# Patient Record
Sex: Female | Born: 1960 | Hispanic: No | Marital: Married | Smoking: Never smoker
Health system: Southern US, Community
[De-identification: ages and names within clinical notes are randomized; demographics above are authoritative.]

## PROBLEM LIST (undated history)

## (undated) DIAGNOSIS — F419 Anxiety disorder, unspecified: Secondary | ICD-10-CM

## (undated) DIAGNOSIS — Z973 Presence of spectacles and contact lenses: Secondary | ICD-10-CM

## (undated) DIAGNOSIS — C50919 Malignant neoplasm of unspecified site of unspecified female breast: Secondary | ICD-10-CM

## (undated) HISTORY — DX: Malignant neoplasm of unspecified site of unspecified female breast: C50.919

## (undated) HISTORY — DX: Anxiety disorder, unspecified: F41.9

---

## 2007-02-16 ENCOUNTER — Emergency Department (HOSPITAL_COMMUNITY): Admission: EM | Admit: 2007-02-16 | Discharge: 2007-02-17 | Payer: Self-pay | Admitting: Emergency Medicine

## 2012-12-17 ENCOUNTER — Emergency Department (HOSPITAL_COMMUNITY): Payer: Managed Care, Other (non HMO)

## 2012-12-17 ENCOUNTER — Encounter (HOSPITAL_COMMUNITY): Payer: Self-pay | Admitting: *Deleted

## 2012-12-17 ENCOUNTER — Emergency Department (HOSPITAL_COMMUNITY)
Admission: EM | Admit: 2012-12-17 | Discharge: 2012-12-17 | Disposition: A | Discharge status: Home / Self Care | Payer: Managed Care, Other (non HMO) | Attending: Emergency Medicine | Admitting: Emergency Medicine

## 2012-12-17 DIAGNOSIS — M545 Low back pain, unspecified: Secondary | ICD-10-CM | POA: Insufficient documentation

## 2012-12-17 DIAGNOSIS — Z3202 Encounter for pregnancy test, result negative: Secondary | ICD-10-CM | POA: Insufficient documentation

## 2012-12-17 DIAGNOSIS — I498 Other specified cardiac arrhythmias: Secondary | ICD-10-CM | POA: Insufficient documentation

## 2012-12-17 DIAGNOSIS — N201 Calculus of ureter: Secondary | ICD-10-CM

## 2012-12-17 DIAGNOSIS — N211 Calculus in urethra: Secondary | ICD-10-CM | POA: Insufficient documentation

## 2012-12-17 LAB — URINALYSIS, ROUTINE W REFLEX MICROSCOPIC
Glucose, UA: NEGATIVE mg/dL
Leukocytes, UA: NEGATIVE
Specific Gravity, Urine: 1.018 (ref 1.005–1.030)
pH: 7 (ref 5.0–8.0)

## 2012-12-17 LAB — COMPREHENSIVE METABOLIC PANEL
AST: 19 U/L (ref 0–37)
Albumin: 4.2 g/dL (ref 3.5–5.2)
Calcium: 9 mg/dL (ref 8.4–10.5)
Chloride: 102 mEq/L (ref 96–112)
Creatinine, Ser: 0.55 mg/dL (ref 0.50–1.10)
Total Bilirubin: 0.6 mg/dL (ref 0.3–1.2)
Total Protein: 7 g/dL (ref 6.0–8.3)

## 2012-12-17 LAB — CBC
MCV: 89.8 fL (ref 78.0–100.0)
Platelets: 197 10*3/uL (ref 150–400)
RDW: 12.5 % (ref 11.5–15.5)
WBC: 12.9 10*3/uL — ABNORMAL HIGH (ref 4.0–10.5)

## 2012-12-17 LAB — URINE MICROSCOPIC-ADD ON

## 2012-12-17 LAB — PREGNANCY, URINE: Preg Test, Ur: NEGATIVE

## 2012-12-17 MED ORDER — ONDANSETRON HCL 4 MG/2ML IJ SOLN
4.0000 mg | Freq: Once | INTRAMUSCULAR | Status: AC
  Administered 2012-12-17: 4 mg via INTRAVENOUS
  Filled 2012-12-17: qty 2

## 2012-12-17 MED ORDER — ONDANSETRON HCL 4 MG PO TABS: 4.0000 mg | ORAL_TABLET | Freq: Four times a day (QID) | ORAL | Status: DC

## 2012-12-17 MED ORDER — SODIUM CHLORIDE 0.9 % IV SOLN
Freq: Once | INTRAVENOUS | Status: AC
  Administered 2012-12-17: 12:00:00 via INTRAVENOUS

## 2012-12-17 MED ORDER — MORPHINE SULFATE 4 MG/ML IJ SOLN
4.0000 mg | Freq: Once | INTRAMUSCULAR | Status: AC
  Administered 2012-12-17: 4 mg via INTRAVENOUS
  Filled 2012-12-17: qty 1

## 2012-12-17 MED ORDER — HYDROMORPHONE HCL PF 1 MG/ML IJ SOLN
1.0000 mg | Freq: Once | INTRAMUSCULAR | Status: AC
  Administered 2012-12-17: 1 mg via INTRAVENOUS
  Filled 2012-12-17: qty 1

## 2012-12-17 MED ORDER — OXYCODONE-ACETAMINOPHEN 5-325 MG PO TABS: 1.0000 | ORAL_TABLET | Freq: Four times a day (QID) | ORAL | Status: DC | PRN

## 2012-12-17 MED ORDER — IOHEXOL 300 MG/ML  SOLN
50.0000 mL | Freq: Once | INTRAMUSCULAR | Status: AC | PRN
  Administered 2012-12-17: 50 mL via ORAL

## 2012-12-17 NOTE — ED Notes (Signed)
Medicated for n/v

## 2012-12-17 NOTE — ED Notes (Addendum)
Pt reports generalized mid to lower back pain started prior to abd pain x 2 days. Pain worse with certain positions. N/v started today. Pt very restless in stretcher upon arrival. Denies diarrhea, urinary frequency, dysuria. Last BM today. Pt vomited 50ml white emesis upon arrival. Abd soft, non distended

## 2012-12-17 NOTE — ED Notes (Signed)
Patient transported to CT 

## 2012-12-17 NOTE — ED Notes (Signed)
Pt denies any heavy lifting or injury to lower back. Also reports today has felt lightheaded & faint

## 2012-12-17 NOTE — ED Notes (Signed)
Pt brought in by [facility]; pt undressed, in gown, on monitor, continuous pulse oximetry and blood pressure cuff

## 2012-12-17 NOTE — ED Notes (Signed)
Pt from an urgent care, called EMS because she couldn't wait to be seen. C/o generalized abd pain, n/v, low back pain x 2 days.Upon EMS arrival pt very pale, diaphoretic, responding only to painful stimuli & had a faint palpable pulse of 41. After receiving NS bolus HR increased to 50's.

## 2012-12-17 NOTE — ED Provider Notes (Signed)
History     CSN: [id number]  Arrival date & time 12/17/12  1135   First MD Initiated Contact with Patient 12/17/12 1136      Chief Complaint  Patient presents with  . Bradycardia  . Abdominal Pain  . Back Pain    (Consider location/radiation/quality/duration/timing/severity/associated sxs/prior treatment) HPI Pt presents with c/o low back pain and diffuse abdominal pain.  Pt states back pain started yesterday and then today the abdominal pain worsened.  She points to umbilical region as the central region of pain.  She called EMS from an urgent care lobby due to worsening pain.  Per EMS she was diaphoretic on their arrival.  She also has had mutliple episodes of emesis today- nonbloody nonbilious.  She tried advil at home today without much relief.  She denies having any hx of similar pain. There are no other associated systemic symptoms, there are no other alleviating or modifying factors.   History reviewed. No pertinent past medical history.  Past Surgical History  Procedure Date  . Cesarean section     No family history on file.  History  Substance Use Topics  . Smoking status: Never Smoker   . Smokeless tobacco: Not on file  . Alcohol Use: No    OB History    Grav Para Term Preterm Abortions TAB SAB Ect Mult Living                  Review of Systems ROS reviewed and all otherwise negative except for mentioned in HPI  Allergies  Review of patient's allergies indicates no known allergies.  Home Medications   Current Outpatient Rx  Name  Route  Sig  Dispense  Refill  . IBUPROFEN 200 MG PO TABS   Oral   Take 400 mg by mouth every 6 (six) hours as needed. For pain         . ONDANSETRON HCL 4 MG PO TABS   Oral   Take 1 tablet (4 mg total) by mouth every 6 (six) hours.   12 tablet   0   . OXYCODONE-ACETAMINOPHEN 5-325 MG PO TABS   Oral   Take 1-2 tablets by mouth every 6 (six) hours as needed for pain.   20 tablet   0     BP 109/37  Pulse 51   Temp 98.3 F (36.8 C) (Oral)  Resp 23  Ht 5\' 3"  (1.6 m)  Wt 157 lb (71.215 kg)  BMI 27.81 kg/m2  SpO2 99%  LMP 09/16/2012 Vitals reviewed Physical Exam Physical Examination: General appearance - alert, well appearing, and in no distress, uncomfortable appearing Mental status - alert, oriented to person, place, and time Eyes - no conjunctival injection, no scleral icterus Mouth - mucous membranes moist, pharynx normal without lesions Chest - clear to auscultation, no wheezes, rales or rhonchi, symmetric air entry Heart - normal rate, regular rhythm, normal S1, S2, no murmurs, rubs, clicks or gallops Abdomen - soft, diffusely tender to palpation, no gaurding and no rebound, nondistended, no masses or organomegaly Back- + right CVA tenderness, no midline c/t/l tenderness Extremities - peripheral pulses normal, no pedal edema, no clubbing or cyanosis Skin - normal coloration and turgor, no rashes  ED Course  Procedures (including critical care time)    Date: 12/17/2012  Rate: 69  Rhythm: normal sinus rhythm  QRS Axis: normal  Intervals: normal  ST/T Wave abnormalities: normal  Conduction Disutrbances: none  Narrative Interpretation: unremarkable      Labs Reviewed  CBC - Abnormal; Notable for the following:    WBC 12.9 (*)     All other components within normal limits  COMPREHENSIVE METABOLIC PANEL - Abnormal; Notable for the following:    Potassium 3.4 (*)     Glucose, Bld 146 (*)     All other components within normal limits  URINALYSIS, ROUTINE W REFLEX MICROSCOPIC - Abnormal; Notable for the following:    APPearance CLOUDY (*)     Hgb urine dipstick LARGE (*)     Ketones, ur 40 (*)     All other components within normal limits  URINE MICROSCOPIC-ADD ON - Abnormal; Notable for the following:    Bacteria, UA FEW (*)     Casts WAXY CAST (*)     All other components within normal limits  LIPASE, BLOOD  PREGNANCY, URINE  URINE CULTURE   Ct Abdomen Pelvis Wo  Contrast  12/17/2012  *RADIOLOGY REPORT*  Clinical Data: Right flank and right pelvic pain, nausea, vomiting, back pain, abdominal pain, bradycardia, lightheadedness  CT ABDOMEN AND PELVIS WITHOUT CONTRAST  Technique:  Multidetector CT imaging of the abdomen and pelvis was performed following the standard protocol without intravenous contrast. Sagittal and coronal MPR images reconstructed from axial data set.  Comparison: None  Findings: Minimal dependent atelectasis at bilateral lung bases. Tiny nonobstructing renal calculus mid inferior pole right kidney image 39. Left hydronephrosis and hydroureter terminating at a 3 mm left UVJ calculus. Within limits of a nonenhanced exam no additional abnormalities of the liver, spleen, pancreas, kidneys or adrenal glands identified.  Normal appearance of the uterus, adnexae, appendix, and right ureter. Bladder otherwise unremarkable. Stomach and bowel loops grossly normal for technique. No mass, adenopathy, free fluid or free air. Persistent left IVC, normal variant. No acute osseous findings.  IMPRESSION: Left hydronephrosis and hydroureter secondary to a 3 mm left UVJ calculus. Tiny nonobstructing right renal calculus.   Original Report Authenticated By: Ulyses Southward, M.D.      1. Ureteral stone       MDM  Pt presenting with c/o low back pain and abdominal pain.  + rbcs in urine.  Mild elevation in WBC.  CT scan shows 3mm left UVJ stone and punctate right stone. Pt has received IV pain and nausea meds in the ED.  She feels improved.  D/w her and daughter the results at bedside.  Pt given information for urology followup.  D/c with rx for pain and nausea meds.  Discharged with strict return precautions.  Pt agreeable with plan.        Ethelda Chick, MD 12/17/12 [phone]

## 2012-12-18 LAB — URINE CULTURE: Colony Count: 45000

## 2013-08-19 ENCOUNTER — Other Ambulatory Visit: Payer: Self-pay | Admitting: Nurse Practitioner

## 2013-08-19 DIAGNOSIS — Z1231 Encounter for screening mammogram for malignant neoplasm of breast: Secondary | ICD-10-CM

## 2013-09-10 ENCOUNTER — Ambulatory Visit
Admission: RE | Admit: 2013-09-10 | Discharge: 2013-09-10 | Disposition: A | Discharge status: Home / Self Care | Payer: No Typology Code available for payment source | Source: Ambulatory Visit | Attending: Nurse Practitioner | Admitting: Nurse Practitioner

## 2013-09-10 DIAGNOSIS — Z1231 Encounter for screening mammogram for malignant neoplasm of breast: Secondary | ICD-10-CM

## 2013-09-17 ENCOUNTER — Other Ambulatory Visit: Payer: Self-pay | Admitting: Nurse Practitioner

## 2013-09-17 DIAGNOSIS — R928 Other abnormal and inconclusive findings on diagnostic imaging of breast: Secondary | ICD-10-CM

## 2013-10-05 ENCOUNTER — Other Ambulatory Visit: Payer: No Typology Code available for payment source

## 2013-11-25 DIAGNOSIS — C50919 Malignant neoplasm of unspecified site of unspecified female breast: Secondary | ICD-10-CM

## 2013-11-25 HISTORY — DX: Malignant neoplasm of unspecified site of unspecified female breast: C50.919

## 2013-11-29 ENCOUNTER — Other Ambulatory Visit: Payer: Self-pay | Admitting: Nurse Practitioner

## 2013-11-29 ENCOUNTER — Ambulatory Visit
Admission: RE | Admit: 2013-11-29 | Discharge: 2013-11-29 | Disposition: A | Discharge status: Home / Self Care | Payer: No Typology Code available for payment source | Source: Ambulatory Visit | Attending: Nurse Practitioner | Admitting: Nurse Practitioner

## 2013-11-29 DIAGNOSIS — R928 Other abnormal and inconclusive findings on diagnostic imaging of breast: Secondary | ICD-10-CM

## 2013-12-09 ENCOUNTER — Other Ambulatory Visit: Payer: No Typology Code available for payment source

## 2013-12-10 ENCOUNTER — Other Ambulatory Visit: Payer: Self-pay | Admitting: Nurse Practitioner

## 2013-12-10 ENCOUNTER — Ambulatory Visit
Admission: RE | Admit: 2013-12-10 | Discharge: 2013-12-10 | Disposition: A | Discharge status: Home / Self Care | Payer: No Typology Code available for payment source | Source: Ambulatory Visit | Attending: Nurse Practitioner | Admitting: Nurse Practitioner

## 2013-12-10 DIAGNOSIS — R928 Other abnormal and inconclusive findings on diagnostic imaging of breast: Secondary | ICD-10-CM

## 2013-12-14 ENCOUNTER — Other Ambulatory Visit: Payer: Self-pay | Admitting: Nurse Practitioner

## 2013-12-14 ENCOUNTER — Ambulatory Visit
Admission: RE | Admit: 2013-12-14 | Discharge: 2013-12-14 | Disposition: A | Discharge status: Home / Self Care | Payer: No Typology Code available for payment source | Source: Ambulatory Visit | Attending: Nurse Practitioner | Admitting: Nurse Practitioner

## 2013-12-14 DIAGNOSIS — C50919 Malignant neoplasm of unspecified site of unspecified female breast: Secondary | ICD-10-CM

## 2013-12-14 DIAGNOSIS — R928 Other abnormal and inconclusive findings on diagnostic imaging of breast: Secondary | ICD-10-CM

## 2013-12-15 ENCOUNTER — Telehealth: Payer: Self-pay | Admitting: *Deleted

## 2013-12-15 NOTE — Telephone Encounter (Signed)
Left vm for pt to return call to schedule [facility] Appt.

## 2013-12-16 ENCOUNTER — Telehealth: Payer: Self-pay | Admitting: *Deleted

## 2013-12-16 DIAGNOSIS — C50412 Malignant neoplasm of upper-outer quadrant of left female breast: Secondary | ICD-10-CM | POA: Insufficient documentation

## 2013-12-16 NOTE — Telephone Encounter (Signed)
Confirmed [facility] for 12/22/13 at 0830.  Instructions and contact information given.

## 2013-12-21 ENCOUNTER — Ambulatory Visit
Admission: RE | Admit: 2013-12-21 | Discharge: 2013-12-21 | Disposition: A | Discharge status: Home / Self Care | Payer: No Typology Code available for payment source | Source: Ambulatory Visit | Attending: Nurse Practitioner | Admitting: Nurse Practitioner

## 2013-12-21 DIAGNOSIS — C50919 Malignant neoplasm of unspecified site of unspecified female breast: Secondary | ICD-10-CM

## 2013-12-21 MED ORDER — GADOBENATE DIMEGLUMINE 529 MG/ML IV SOLN
14.0000 mL | Freq: Once | INTRAVENOUS | Status: AC | PRN
  Administered 2013-12-21: 14 mL via INTRAVENOUS

## 2013-12-22 ENCOUNTER — Ambulatory Visit: Payer: No Typology Code available for payment source | Attending: Surgery | Admitting: Physical Therapy

## 2013-12-22 ENCOUNTER — Ambulatory Visit: Payer: No Typology Code available for payment source

## 2013-12-22 ENCOUNTER — Ambulatory Visit (HOSPITAL_BASED_OUTPATIENT_CLINIC_OR_DEPARTMENT_OTHER): Payer: No Typology Code available for payment source | Admitting: Surgery

## 2013-12-22 ENCOUNTER — Ambulatory Visit (HOSPITAL_BASED_OUTPATIENT_CLINIC_OR_DEPARTMENT_OTHER): Payer: No Typology Code available for payment source | Admitting: Oncology

## 2013-12-22 ENCOUNTER — Encounter: Payer: Self-pay | Admitting: Oncology

## 2013-12-22 ENCOUNTER — Ambulatory Visit
Admission: RE | Admit: 2013-12-22 | Discharge: 2013-12-22 | Disposition: A | Discharge status: Home / Self Care | Payer: No Typology Code available for payment source | Source: Ambulatory Visit | Attending: Radiation Oncology | Admitting: Radiation Oncology

## 2013-12-22 ENCOUNTER — Encounter (INDEPENDENT_AMBULATORY_CARE_PROVIDER_SITE_OTHER): Payer: Self-pay | Admitting: Surgery

## 2013-12-22 ENCOUNTER — Other Ambulatory Visit: Payer: No Typology Code available for payment source

## 2013-12-22 ENCOUNTER — Other Ambulatory Visit (HOSPITAL_BASED_OUTPATIENT_CLINIC_OR_DEPARTMENT_OTHER): Payer: No Typology Code available for payment source

## 2013-12-22 VITALS — BP 113/73 | HR 56 | Temp 98.6°F | Resp 18 | Ht 63.0 in | Wt 150.5 lb

## 2013-12-22 DIAGNOSIS — C50412 Malignant neoplasm of upper-outer quadrant of left female breast: Secondary | ICD-10-CM

## 2013-12-22 DIAGNOSIS — R293 Abnormal posture: Secondary | ICD-10-CM | POA: Insufficient documentation

## 2013-12-22 DIAGNOSIS — C50419 Malignant neoplasm of upper-outer quadrant of unspecified female breast: Secondary | ICD-10-CM

## 2013-12-22 DIAGNOSIS — C50919 Malignant neoplasm of unspecified site of unspecified female breast: Secondary | ICD-10-CM

## 2013-12-22 DIAGNOSIS — IMO0001 Reserved for inherently not codable concepts without codable children: Secondary | ICD-10-CM | POA: Insufficient documentation

## 2013-12-22 DIAGNOSIS — Z170 Estrogen receptor positive status [ER+]: Secondary | ICD-10-CM

## 2013-12-22 LAB — CBC WITH DIFFERENTIAL/PLATELET
BASO%: 0.8 % (ref 0.0–2.0)
Basophils Absolute: 0 10*3/uL (ref 0.0–0.1)
EOS%: 4.2 % (ref 0.0–7.0)
Eosinophils Absolute: 0.2 10*3/uL (ref 0.0–0.5)
HCT: 42.4 % (ref 34.8–46.6)
HGB: 14.4 g/dL (ref 11.6–15.9)
LYMPH%: 38.3 % (ref 14.0–49.7)
MCH: 31.4 pg (ref 25.1–34.0)
MCHC: 33.9 g/dL (ref 31.5–36.0)
MCV: 92.6 fL (ref 79.5–101.0)
MONO#: 0.4 10*3/uL (ref 0.1–0.9)
MONO%: 7.8 % (ref 0.0–14.0)
NEUT#: 2.7 10*3/uL (ref 1.5–6.5)
NEUT%: 48.9 % (ref 38.4–76.8)
PLATELETS: 197 10*3/uL (ref 145–400)
RBC: 4.58 10*6/uL (ref 3.70–5.45)
RDW: 13.1 % (ref 11.2–14.5)
WBC: 5.5 10*3/uL (ref 3.9–10.3)
lymph#: 2.1 10*3/uL (ref 0.9–3.3)

## 2013-12-22 LAB — COMPREHENSIVE METABOLIC PANEL (CC13)
ALBUMIN: 4.2 g/dL (ref 3.5–5.0)
ALK PHOS: 48 U/L (ref 40–150)
ALT: 15 U/L (ref 0–55)
AST: 17 U/L (ref 5–34)
Anion Gap: 10 mEq/L (ref 3–11)
BILIRUBIN TOTAL: 0.89 mg/dL (ref 0.20–1.20)
BUN: 10.2 mg/dL (ref 7.0–26.0)
CO2: 29 mEq/L (ref 22–29)
Calcium: 9.5 mg/dL (ref 8.4–10.4)
Chloride: 103 mEq/L (ref 98–109)
Creatinine: 0.7 mg/dL (ref 0.6–1.1)
Glucose: 97 mg/dl (ref 70–140)
POTASSIUM: 4 meq/L (ref 3.5–5.1)
Sodium: 142 mEq/L (ref 136–145)
TOTAL PROTEIN: 7.2 g/dL (ref 6.4–8.3)

## 2013-12-22 NOTE — Progress Notes (Signed)
Regina Martinez [id number] 27-Feb-1961 53 y.o. 12/22/2013 12:34 PM  CC Dr. Thea Silversmith Dr. Tandy Gaw, MD [place] [id number]  REASON FOR CONSULTATION:  53 year old female with new diagnosis of left breast cancer. Patient is seen in the multidisciplinary breast clinic for discussion of treatment options.  STAGE:   Breast cancer of upper-outer quadrant of left female breast   Primary site: Breast (Left)   Staging method: AJCC 7th Edition   Clinical: Stage IA (T1c, N0, cM0)   Summary: Stage IA (T1c, N0, cM0)  REFERRING PHYSICIAN: Dr. Marcello Moores Cornett  HISTORY OF PRESENT ILLNESS:  Regina Martinez is a 52 y.o. female.  Underwent a screening mammogram that revealed a left breast mass with calcifications in October 2014. This mass was in the posterior aspect of the upper outer quadrant at the 3:00 position. Patient went on to have an ultrasound of the left breast that did reveal 2 distinct masses one measuring 8 mm and the second measuring 4 mm. There was also noted to be a lymph node which was biopsied and it was negative for metastatic cancer. 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 She had MRI of the breasts performed that showed 2 areas with a full measurement at 1.4 cm. She was also noted to have 2 cm area of clumped nodular enhancement. Corresponding to the areas of biopsy-proven DCIS/invasive ductal carcinoma. There was no lymphadenopathy identified. Patient's case was discussed at the multidisciplinary breast conference. She is now seen in the multidisciplinary breast clinic for discussion of treatment options. She is accompanied by a friend. Her husband currently is in [place]. She is without any complaints related to her breast cancer.    Past Medical History: Past Medical History  Diagnosis Date  .  Anxiety     Past Surgical History: Past Surgical History  Procedure Laterality Date  . Cesarean section      Family History: Family History  Problem Relation Age of Onset  . Breast cancer Mother   . Breast cancer Sister     Social History History  Substance Use Topics  . Smoking status: Never Smoker   . Smokeless tobacco: Not on file  . Alcohol Use: No    Allergies: No Known Allergies  Current Medications: Current Outpatient Prescriptions  Medication Sig Dispense Refill  . Calcium Carb-Cholecalciferol (CALCIUM 600 + D PO) Take 1 tablet by mouth daily.      . Cholecalciferol (VITAMIN D-3 PO) Take 2,000 Int'l Units by mouth daily.      Marland Kitchen HYDROcodone-homatropine (HYCODAN) 5-1.5 MG/5ML syrup Take 5 mLs by mouth every 6 (six) hours as needed for cough.      Marland Kitchen ibuprofen (ADVIL,MOTRIN) 200 MG tablet Take 400 mg by mouth every 6 (six) hours as needed. For pain      . ondansetron (ZOFRAN) 4 MG tablet Take 1 tablet (4 mg total) by mouth every 6 (six) hours.  12 tablet  0  . oxyCODONE-acetaminophen (PERCOCET/ROXICET) 5-325 MG per tablet Take 1-2 tablets by mouth every 6 (six) hours as needed for pain.  20 tablet  0   No current facility-administered medications for this visit.    OB/GYN History:patient Endo Ronalee Belts menarche at age 66 menopause in 2011 she has not been on hormone replacement therapy. She's had 3 term births first live birth at 61.  Fertility Discussion: n/a  Prior History of Cancer: no  Health Maintenance:  Colonoscopy no Bone Density no Last PAP smear up to date  ECOG PERFORMANCE STATUS: 0 - Asymptomatic  Genetic Counseling/testing: yes, patient had a sister with breast cancer and mom with counts  REVIEW OF SYSTEMS:  A comprehensive review of systems was negative.  PHYSICAL EXAMINATION: Blood pressure 113/73, pulse 56, temperature 98.6 F (37 C), temperature source Oral, resp. rate 18, height 5' 3"  (1.6 m), weight 150 lb 8 oz (68.266 kg).  General:   well-nourished in no acute distress.  Eyes:  no scleral icterus.  ENT:  There were no oropharyngeal lesions.  Neck was without thyromegaly.  Lymphatics:  Negative cervical, supraclavicular or axillary adenopathy.  Respiratory: lungs were clear bilaterally without wheezing or crackles.  Cardiovascular:  Regular rate and rhythm, S1/S2, without murmur, rub or gallop.  There was no pedal edema.  GI:  abdomen was soft, flat, nontender, nondistended, without organomegaly.  Muscoloskeletal:  no spinal tenderness of palpation of vertebral spine.  Skin exam was without echymosis, petichae.  Neuro exam was nonfocal.  Patient was able to get on and off exam table without assistance.  Gait was normal.  Patient was alerted and oriented.  Attention was good.   Language was appropriate.  Mood was normal without depression.  Speech was not pressured.  Thought content was not tangential.   Breasts: right breast normal without mass, skin or nipple changes or axillary nodes, left breast appears normal there is an area of ecchymosis from the recent biopsy with some thickening at that site. Otherwise no nipple discharge no other skin changes no dominant masses..   STUDIES/RESULTS: Mr Breast Bilateral W Wo Contrast  12/21/2013   CLINICAL DATA:  Recently biopsy proven left breast 3 o'clock location invasive ductal carcinoma and DCIS with a positive satellite nodule 1 cm away from the dominant mass. Benign left axillary lymph node biopsy.  EXAM: BILATERAL BREAST MRI WITH AND WITHOUT CONTRAST  LABS:  None  TECHNIQUE: Multiplanar, multisequence MR images of both breasts were obtained prior to and following the intravenous administration of 68m of MultiHance.  THREE-DIMENSIONAL MR IMAGE RENDERING ON INDEPENDENT WORKSTATION:  Three-dimensional MR images were rendered by post-processing of the original MR data on an independent workstation. The three-dimensional MR images were interpreted, and findings are reported in the following  complete MRI report for this study. Three dimensional images were evaluated at the independent DynaCad workstation  COMPARISON:  Previous exams, CT abdomen pelvis 12/17/2012  FINDINGS: Breast composition: b.  Scattered fibroglandular tissue.  Background parenchymal enhancement: Minimal  Right breast: No mass or abnormal enhancement.  Left breast: In the left breast far posterior 3 o'clock location, there is a 2 cm area of clumped nodular enhancement with minimal plateau type enhancement kinetics, and associated clip artifact, corresponding to the areas of biopsy proven DCIS/invasive ductal carcinoma. No other dominant area of abnormal enhancement is seen elsewhere in either breast.  Lymph nodes: No lymphadenopathy is identified, with minimal cortical prominence of normal-sized left axillary lymph nodes reidentified. One of these with minimal eccentric cortical thickening likely corresponds to a recently biopsied benign left axillary lymph node.  Ancillary findings: At incomplete imaging of the upper abdomen, there is a 5 mm nonenhancing T2 hyperintense mass in the lateral segment left hepatic lobe most likely a cyst. This is not visible on the prior dissimilar exam, possibly due to its small size.  IMPRESSION: 2 cm area of minimal clumped nodular enhancement with plateau type enhancement kinetics  at the site of biopsy proven invasive ductal carcinoma/DCIS. Allowing for the subtlety of this finding, there is no MRI evidence for multifocal/ multicentric or contralateral malignancy.  RECOMMENDATION: Treatment plan  BI-RADS CATEGORY  6: Known biopsy-proven malignancy - appropriate action should be taken.   Electronically Signed   By: Conchita Paris M.D.   On: 12/21/2013 10:38   Mm Digital Diagnostic Unilat L  12/10/2013   CLINICAL DATA:  Status post ultrasound-guided core needle biopsy of two adjacent suspicious left breast masses  EXAM: DIAGNOSTIC LEFT MAMMOGRAM POST ULTRASOUND BIOPSY  COMPARISON:  Previous exams   FINDINGS: Mammographic images were obtained following ultrasound guided biopsy of two adjacent suspicious left breast masses. Note the initially biopsied smaller mass contains a ribbon shaped clip and the subsequently biopsied larger mass contains a heart shaped clip.  IMPRESSION: Appropriate position of biopsy marking clips within the left breast status post ultrasound-guided core needle biopsy.  Final Assessment: Post Procedure Mammograms for Marker Placement   Electronically Signed   By: Lovey Newcomer M.D.   On: 12/10/2013 17:10   Mm Digital Diagnostic Unilat L  11/29/2013   CLINICAL DATA:  Patient was recalled from screening for left breast mass.  EXAM: DIGITAL DIAGNOSTIC  LEFT MAMMOGRAM  ULTRASOUND LEFT BREAST  COMPARISON:  None  ACR Breast Density Category c: The breasts are heterogeneously dense, which may obscure small masses.  FINDINGS: Within the left breast 3 o'clock position posterior depth there is a 1.2 cm irregular mass which persists on additional spot-compression magnification views. No definite calcifications are demonstrated within this mass.  On physical exam I palpate no discrete mass within the lateral aspect of the left breast.  Ultrasound is performed, showing a 0.8 x 0.6 x 0.8 cm irregular hypoechoic mass within the left breast 3 o'clock position 15 cm from the nipple. Additionally there is an enlarged lymph node within the left axilla.  IMPRESSION: Suspicious left breast mass with enlarged left axillary lymph node.  RECOMMENDATION: Ultrasound-guided core needle biopsy suspicious left breast mass and enlarged left axillary lymph node  Scheduled for December 09, 2013 at 8 a.m.  I have discussed the findings and recommendations with the patient. Results were also provided in writing at the conclusion of the visit. If applicable, a reminder letter will be sent to the patient regarding the next appointment.  BI-RADS CATEGORY  5: Highly suggestive of malignancy - appropriate action should be  taken.   Electronically Signed   By: Lovey Newcomer M.D.   On: 11/29/2013 09:38   Mm Radiologist Eval And Mgmt  12/14/2013   EXAM: ESTABLISHED PATIENT OFFICE VISIT - LEVEL II ([id number])  EXAM: At physical exam, there is mild ecchymosis at the left axillary and left breast 3 o'clock location biopsy sites. The incisions are otherwise clean and dry.  HISTORY OF PRESENT ILLNESS: Biopsy was recommended for an irregular left breast mass 3 o'clock location with an adjacent satellite nodule and abnormal left axillary lymph node.  CHIEF COMPLAINT: The patient underwent ultrasound-guided core biopsy of 2 locations in the left breast 3 o'clock location and left axilla 2015 and presents today for discussion of her biopsy results.  PATHOLOGY: Pathology demonstrates invasive ductal carcinoma and DCIS at both locations in the left breast 3 o'clock location but a benign left axillary lymph node.  ASSESSMENT AND PLAN: ASSESSMENT AND PLAN Treatment plan is recommended and breast MRI has been scheduled 12/21/2013 at 8:45 a.m. [facility] has been scheduled  12/22/2013. The patient may have a schedule conflict with her scheduled MRI 12/21/2013 but she is encouraged to re-schedule her MRI appointment if at all possible prior to her clinic appointment. All questions were answered. I also discussed these findings and recommendations with Regina Martinez, the daughter of the patient, by telephone at the patient's request.   Electronically Signed   By: Conchita Paris M.D.   On: 12/14/2013 15:10   US Breast Complete Uni Left Inc Axilla  11/29/2013   CLINICAL DATA:  Patient was recalled from screening for left breast mass.  EXAM: DIGITAL DIAGNOSTIC  LEFT MAMMOGRAM  ULTRASOUND LEFT BREAST  COMPARISON:  None  ACR Breast Density Category c: The breasts are heterogeneously dense, which may obscure small masses.  FINDINGS: Within the left breast 3 o'clock position posterior depth there is a 1.2 cm irregular  mass which persists on additional spot-compression magnification views. No definite calcifications are demonstrated within this mass.  On physical exam I palpate no discrete mass within the lateral aspect of the left breast.  Ultrasound is performed, showing a 0.8 x 0.6 x 0.8 cm irregular hypoechoic mass within the left breast 3 o'clock position 15 cm from the nipple. Additionally there is an enlarged lymph node within the left axilla.  IMPRESSION: Suspicious left breast mass with enlarged left axillary lymph node.  RECOMMENDATION: Ultrasound-guided core needle biopsy suspicious left breast mass and enlarged left axillary lymph node  Scheduled for December 09, 2013 at 8 a.m.  I have discussed the findings and recommendations with the patient. Results were also provided in writing at the conclusion of the visit. If applicable, a reminder letter will be sent to the patient regarding the next appointment.  BI-RADS CATEGORY  5: Highly suggestive of malignancy - appropriate action should be taken.   Electronically Signed   By: Lovey Newcomer M.D.   On: 11/29/2013 09:38   [place] Lt Breast Bx W Loc Dev 1st Lesion Img Bx Spec US Guide  12/10/2013   CLINICAL DATA:  Patient with suspicious left breast mass and left axillary lymphadenopathy.  EXAM: ULTRASOUND GUIDED LEFT BREAST CORE NEEDLE BIOPSY WITH VACUUM ASSIST  COMPARISON:  Previous exams.  PROCEDURE: I met with the patient and we discussed the procedure of ultrasound-guided biopsy, including benefits and alternatives. We discussed the high likelihood of a successful procedure. We discussed the risks of the procedure including infection, bleeding, tissue injury, clip migration, and inadequate sampling. Informed written consent was given. The usual time-out protocol was performed immediately prior to the procedure.  While performing the ultrasound to localize the left breast lesion it was noted that there is a separate 0.4 cm irregular hypoechoic lesion just adjacent to the  larger previously described mass within the left breast 3 o'clock position. These masses are approximately 1 cm apart. This mass will be biopsied as well as the larger previously described mass, both located within the 3 o'clock position left breast.  Lesion 1/smaller mass (ribbon clip):  Using sterile technique and 2% Lidocaine as local anesthetic, under direct ultrasound visualization, a 12 gauge vacuum-assisteddevice was used to perform biopsy of suspicious hypoechoic mass within the left breast using a lateral approach. At the conclusion of the procedure, a ribbon shaped tissue marker clip was deployed into the biopsy cavity. Follow-up 2-view mammogram was performed and dictated separately.  Lesion 2/larger mass (heart shaped):  Using sterile technique and 2% Lidocaine as local anesthetic, under direct ultrasound visualization, a 12 gauge vacuum-assisteddevice was used to perform biopsy of suspicious  hypoechoic mass within the left breast using a lateral approach. At the conclusion of the procedure, a heart shaped tissue marker clip was deployed into the biopsy cavity. Follow-up 2-view mammogram was performed and dictated separately.  Lesion 3 (left axillary lymph node):  Using sterile technique and 2% Lidocaine as local anesthetic, under direct ultrasound visualization, a 12 gauge vacuum-assisteddevice was used to perform biopsy of enlarged left axillary lymph node using a lateral approach. No clip was placed within the lymph node.  IMPRESSION: Ultrasound-guided biopsy of two suspicious left breast masses and enlarged left axillary lymph node. No apparent complications.  Note while localizing the initially described larger suspicious left breast mass a separate smaller adjacent suspicious mass was identified and both of these masses were biopsied separately. They are located approximately 1 cm apart.   Electronically Signed   By: Lovey Newcomer M.D.   On: 12/10/2013 17:09   [place] Lt Breast Bx W Loc Dev Ea Add  Lesion Img Bx Spec US Guide  12/10/2013   CLINICAL DATA:  Patient with suspicious left breast mass and left axillary lymphadenopathy.  EXAM: ULTRASOUND GUIDED LEFT BREAST CORE NEEDLE BIOPSY WITH VACUUM ASSIST  COMPARISON:  Previous exams.  PROCEDURE: I met with the patient and we discussed the procedure of ultrasound-guided biopsy, including benefits and alternatives. We discussed the high likelihood of a successful procedure. We discussed the risks of the procedure including infection, bleeding, tissue injury, clip migration, and inadequate sampling. Informed written consent was given. The usual time-out protocol was performed immediately prior to the procedure.  While performing the ultrasound to localize the left breast lesion it was noted that there is a separate 0.4 cm irregular hypoechoic lesion just adjacent to the larger previously described mass within the left breast 3 o'clock position. These masses are approximately 1 cm apart. This mass will be biopsied as well as the larger previously described mass, both located within the 3 o'clock position left breast.  Lesion 1/smaller mass (ribbon clip):  Using sterile technique and 2% Lidocaine as local anesthetic, under direct ultrasound visualization, a 12 gauge vacuum-assisteddevice was used to perform biopsy of suspicious hypoechoic mass within the left breast using a lateral approach. At the conclusion of the procedure, a ribbon shaped tissue marker clip was deployed into the biopsy cavity. Follow-up 2-view mammogram was performed and dictated separately.  Lesion 2/larger mass (heart shaped):  Using sterile technique and 2% Lidocaine as local anesthetic, under direct ultrasound visualization, a 12 gauge vacuum-assisteddevice was used to perform biopsy of suspicious hypoechoic mass within the left breast using a lateral approach. At the conclusion of the procedure, a heart shaped tissue marker clip was deployed into the biopsy cavity. Follow-up 2-view  mammogram was performed and dictated separately.  Lesion 3 (left axillary lymph node):  Using sterile technique and 2% Lidocaine as local anesthetic, under direct ultrasound visualization, a 12 gauge vacuum-assisteddevice was used to perform biopsy of enlarged left axillary lymph node using a lateral approach. No clip was placed within the lymph node.  IMPRESSION: Ultrasound-guided biopsy of two suspicious left breast masses and enlarged left axillary lymph node. No apparent complications.  Note while localizing the initially described larger suspicious left breast mass a separate smaller adjacent suspicious mass was identified and both of these masses were biopsied separately. They are located approximately 1 cm apart.   Electronically Signed   By: Lovey Newcomer M.D.   On: 12/10/2013 17:09   [place] Lt Breast Bx W Loc Dev Ea Add Lesion  Img Bx Spec US Guide  12/10/2013   CLINICAL DATA:  Patient with suspicious left breast mass and left axillary lymphadenopathy.  EXAM: ULTRASOUND GUIDED LEFT BREAST CORE NEEDLE BIOPSY WITH VACUUM ASSIST  COMPARISON:  Previous exams.  PROCEDURE: I met with the patient and we discussed the procedure of ultrasound-guided biopsy, including benefits and alternatives. We discussed the high likelihood of a successful procedure. We discussed the risks of the procedure including infection, bleeding, tissue injury, clip migration, and inadequate sampling. Informed written consent was given. The usual time-out protocol was performed immediately prior to the procedure.  While performing the ultrasound to localize the left breast lesion it was noted that there is a separate 0.4 cm irregular hypoechoic lesion just adjacent to the larger previously described mass within the left breast 3 o'clock position. These masses are approximately 1 cm apart. This mass will be biopsied as well as the larger previously described mass, both located within the 3 o'clock position left breast.  Lesion 1/smaller mass  (ribbon clip):  Using sterile technique and 2% Lidocaine as local anesthetic, under direct ultrasound visualization, a 12 gauge vacuum-assisteddevice was used to perform biopsy of suspicious hypoechoic mass within the left breast using a lateral approach. At the conclusion of the procedure, a ribbon shaped tissue marker clip was deployed into the biopsy cavity. Follow-up 2-view mammogram was performed and dictated separately.  Lesion 2/larger mass (heart shaped):  Using sterile technique and 2% Lidocaine as local anesthetic, under direct ultrasound visualization, a 12 gauge vacuum-assisteddevice was used to perform biopsy of suspicious hypoechoic mass within the left breast using a lateral approach. At the conclusion of the procedure, a heart shaped tissue marker clip was deployed into the biopsy cavity. Follow-up 2-view mammogram was performed and dictated separately.  Lesion 3 (left axillary lymph node):  Using sterile technique and 2% Lidocaine as local anesthetic, under direct ultrasound visualization, a 12 gauge vacuum-assisteddevice was used to perform biopsy of enlarged left axillary lymph node using a lateral approach. No clip was placed within the lymph node.  IMPRESSION: Ultrasound-guided biopsy of two suspicious left breast masses and enlarged left axillary lymph node. No apparent complications.  Note while localizing the initially described larger suspicious left breast mass a separate smaller adjacent suspicious mass was identified and both of these masses were biopsied separately. They are located approximately 1 cm apart.   Electronically Signed   By: Lovey Newcomer M.D.   On: 12/10/2013 17:09     LABS:    Chemistry      Component Value Date/Time   NA 142 12/22/2013 0824   NA 141 12/17/2012 1204   K 4.0 12/22/2013 0824   K 3.4* 12/17/2012 1204   CL 102 12/17/2012 1204   CO2 29 12/22/2013 0824   CO2 26 12/17/2012 1204   BUN 10.2 12/22/2013 0824   BUN 11 12/17/2012 1204   CREATININE 0.7 12/22/2013  0824   CREATININE 0.55 12/17/2012 1204      Component Value Date/Time   CALCIUM 9.5 12/22/2013 0824   CALCIUM 9.0 12/17/2012 1204   ALKPHOS 48 12/22/2013 0824   ALKPHOS 53 12/17/2012 1204   AST 17 12/22/2013 0824   AST 19 12/17/2012 1204   ALT 15 12/22/2013 0824   ALT 17 12/17/2012 1204   BILITOT 0.89 12/22/2013 0824   BILITOT 0.6 12/17/2012 1204      Lab Results  Component Value Date   WBC 5.5 12/22/2013   HGB 14.4 12/22/2013   HCT 42.4 12/22/2013  MCV 92.6 12/22/2013   PLT 197 12/22/2013    ASSESSMENT/PLAN  53 year old female with  #1 new diagnosis of 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 Found on a screening mammogram. Tumor is ER positive PR positive HER-2/neu positive. With proliferation marker Ki-67 in the intermediate range.  #2 We spent the better part of today's hour-long appointment discussing the biology of breast cancer in general, and the specifics of the patient's tumor in particular.Patient and I went over her pathology in detail today. We discussed the significance of the HER-2/neu receptor as well as the estrogen and progesterone receptors. We discussed her treatment options. Patient is a candidate for chemotherapy along with anti-HER-2 therapy such as Herceptin to be given adjuvantly in her case.we discussed the rationale as well as risks and benefits of chemotherapy as well as anti-HER-2 therapy. We discussed the neoadjuvant antiestrogen approach to treatment with chemotherapy and anti-HER-2 therapy. We also discussed role of adjuvant antiestrogen therapy. We discussed treatment with chemotherapy consisting of Taxotere and carboplatinum to be given every 3 weeks for a total of 6 cycles. We discussed Herceptin given weekly with chemotherapy. Once patient completes chemotherapy she will receive Herceptin every 3 weeks to finish out 1 year of Herceptin.  #3 we discussed genetic counseling and testing due to patient's family history of breast cancer  in her mother as well as her sister. We discussed the rationale for testing as well as implications of testing to her as well as her offspring spent other family members.  #4 we discussed the need for proceeding with a Port-A-Cath placement at the time of the lumpectomy and sentinel lymph node biopsy. We also discussed chemotherapy education class, echocardiogram to monitor patient's heart function throughout the HER-2 therapy. We also discussed role of cardiology in monitoring her heart function  #5 I will plan on seeing the patient back after she has had her definitive surgery.   Clinical Trial Eligibility: no Multidisciplinary conference discussion yes      Discussion: Patient is being treated per [facility] breast cancer care guidelines appropriate for stage.I   Thank you so much for allowing me to participate in the care of Regina Martinez. I will continue to follow up the patient with you and assist in her care.  All questions were answered. The patient knows to call the clinic with any problems, questions or concerns. We can certainly see the patient much sooner if necessary.  I spent 55 minutes counseling the patient face to face. The total time spent in the appointment was 60 minutes.  Marcy Panning, MD Medical/Oncology [facility] [phone] (beeper) [phone] (Office)  12/22/2013, 12:34 PM

## 2013-12-22 NOTE — Patient Instructions (Signed)
[facility]  neulasta  Echocardiogram  Cardiology referral

## 2013-12-22 NOTE — Patient Instructions (Signed)
Sentinel Lymph Node Biopsy Sentinel lymph node biopsy is a procedure in which a single lymph node is identified, removed, and examined for cancer. Lymph nodes are collections of tissue that help filter infections, cancer cells, and other waste substances from the bloodstream. Certain types of cancer can spread to nearby lymph nodes. The cancer spreads to one lymph node first, and then to others. The first lymph node that your cancer could spread to is called the sentinel lymph node. Examining the sentinel lymph node for cancer can help your caregiver plan future treatment for you. LET YOUR CAREGIVER KNOW ABOUT:  Allergies to food or medicine. Medicines taken, including vitamins, herbs, eyedrops, over-the-counter medicines, and creams. Use of steroids (by mouth or creams). Previous problems with numbing medicines. History of bleeding problems or blood clots. Previous surgery. Other health problems, including diabetes and kidney problems. Possibility of pregnancy, if this applies. RISKS AND COMPLICATIONS  Infection. Bleeding. Allergic reaction to the dye used for the procedure. Blue staining of the skin where the dye is injected. Damaged lymph vessels, causing a buildup of fluid (lymphedema). Pain or bruising at the biopsy site. BEFORE THE PROCEDURE  Stop smoking at least 2 weeks before the procedure. Not smoking will improve your health after the procedure and decrease the chance of getting a wound infection. You may have blood tests to make sure your blood clots normally. Ask your caregiver about changing or stopping your regular medicines. Do not eat or drink anything for 8 hours before the procedure. PROCEDURE  You will be given medicine that makes you sleep (general anesthetic). A blue, radioactive dye will be injected near the tumor. The dye will then spread into the sentinel lymph node. A scanner will identify the sentinel lymph node. A small cut (incision) will be made, and the  sentinel lymph node will be removed. The sentinel lymph node will be examined in a lab. Sometimes, a sentinel lymph node biopsy is performed during another surgery, such as a mastectomy or lumpectomy for breast cancer.  AFTER THE PROCEDURE  You will go to a recovery room. You will be monitored for several hours. If complications do not occur, you will be allowed to go home a few hours after the procedure. Your urine may be blue for the next 24 hours. This is normal. It is caused by the dye used during the procedure. Your skin where the dye was injected may be blue for up to 8 weeks. Document Released: 02/03/2012 Document Reviewed: 02/03/2012 [facility] Patient Information 2014 [place]. Lumpectomy A lumpectomy is a form of "breast conserving" or "breast preservation" surgery. It may also be referred to as a partial mastectomy. During a lumpectomy, the portion of the breast that contains the cancerous tumor or breast mass (the lump) is removed. Some normal tissue around the lump may also be removed to make sure all the tumor has been removed. This surgery should take 40 minutes or less. LET [facility] CARE PROVIDER KNOW ABOUT: Any allergies you have. All medicines you are taking, including vitamins, herbs, eye drops, creams, and over-the-counter medicines. Previous problems you or members of your family have had with the use of anesthetics. Any blood disorders you have. Previous surgeries you have had. Medical conditions you have. RISKS AND COMPLICATIONS Generally, this is a safe procedure. However, as with any procedure, complications can occur. Possible complications include: Bleeding. Infection. Pain. Temporary swelling. Change in the shape of the breast, particularly if a large portion is removed. BEFORE THE PROCEDURE Ask  your health care provider about changing or stopping your regular medicines. Do not eat or drink anything for 7 8 hours before the surgery or as directed by  your health care provider. Ask your health care provider if you can take a sip of water with any approved medicines. On the day of surgery, your healthcare provider will use a mammogram or ultrasound to locate and mark the tumor in your breast. These markings on your breast will show where the cut (incision) will be made. PROCEDURE  An IV tube will be put into one of your veins. You may be given medicine to help you relax before the surgery (sedative). You will be given one of the following: A medicine that numbs the area (local anesthesia). A medicine that makes you go to sleep (general anesthesia). Your health care provider will use a kind of electric scalpel that uses heat to minimize bleeding (electrocautery knife). A curved incision (like a smile or frown) that follows the natural curve of your breast is made, to allow for minimal scarring and better healing. The tumor will be removed with some of the surrounding tissue. This will be sent to the lab for analysis. Your health care provider may also remove your lymph nodes at this time if needed. Sometimes, but not always, a rubber tube called a drain will be surgically inserted into your breast area or armpit to collect excess fluid that may accumulate in the space where the tumor was. This drain is connected to a plastic bulb on the outside of your body. This drain creates suction to help remove the fluid. The incisions will be closed with stitches (sutures). A bandage may be placed over the incisions. AFTER THE PROCEDURE You will be taken to the recovery area. You will be given medicine for pain. A small rubber drain may be placed in the breast for 2 3 days to prevent a collection of blood (hematoma) from developing in the breast. You will be given instructions on caring for the drain before you go home. A pressure bandage (dressing) will be applied for 1 2 days to prevent bleeding. Ask your health care provider how to care for your bandage at  home. Document Released: 12/23/2006 Document Revised: 07/14/2013 Document Reviewed: 04/16/2013 [facility] Patient Information 2014 [place]. Implanted Port Insertion An implanted port is a central line that has a round shape and is placed under the skin. It is used as a long-term IV access for:   Medicines, such as chemotherapy.   Fluids.   Liquid nutrition, such as total parenteral nutrition (TPN).   Blood samples.  LET [facility] CARE PROVIDER KNOW ABOUT:  Allergies to food or medicine.   Medicines taken, including vitamins, herbs, eye drops, creams, and over-the-counter medicines.   Any allergies to heparin.  Use of steroids (by mouth or creams).   Previous problems with anesthetics or numbing medicines.   History of bleeding problems or blood clots.   Previous surgery.   Other health problems, including diabetes and kidney problems.   Possibility of pregnancy, if this applies. RISKS AND COMPLICATIONS  Damage to the blood vessel, bruising, or bleeding at the puncture site.   Infection.  Blood clot in the vessel that the port is in.  Breakdown of the skin over your port.  Very rarely a person may develop a condition called a pneumothorax, a collection of air in the chest that may cause one of the lungs to collapse. The placement of these catheters with the  appropriate imaging guidance significantly decreases the risk of a pneumothorax.  BEFORE THE PROCEDURE   Your health care provider may want you to have blood tests. These tests can help tell how well your kidneys and liver are working. They can also show how well your blood clots.   If you take blood thinners (anticoagulant medicines), ask your health care provider when you should stop taking them.   Make arrangements for someone to drive you home. This is necessary if you have been sedated for your procedure.  PROCEDURE  Port insertion usually takes about 30 45 minutes.   An IV needle  will be inserted in your arm. Medicine for pain and medicine to help relax you (sedative) will flow directly into your body through this needle.   You will lie on an exam table, and you will be connected to monitors to keep track of your heart rate, blood pressure, and breathing throughout the procedure.  An oxygen monitoring device may be attached to your finger. Oxygen will be given.   Everything is kept as germ free (sterile) as possible during the procedure. The skin near the point of the incision will be cleaned with antiseptic, and the area will be draped with sterile towels. The skin and deeper tissues over the port area will be made numb with a local anesthetic.  Two small cuts (incisions) will be made in the skin to insert the port. One will be made in the neck to obtain access to the vein where the catheter will lie.   Because the port reservoir is placed under the skin, a small skin incision is made in the upper chest, and a small pocket for the port is made under the skin. The catheter connected to the port tunnels to a large central vein in the chest. A small, raised area remains on your body at the site of the reservoir when the procedure is complete.  The port placement is done under imaging guidance to ensure the proper placement.  The reservoir has a silicone covering that can be punctured with a special needle.   The port is flushed with normal saline, and blood is drawn to make sure it is working properly.  There is nothing remaining outside the skin when the procedure is finished.   Incisions are held together by stitches, surgical glue, or a special tape.  AFTER THE PROCEDURE  You will stay in a recovery area until the anesthesia has worn off. Your blood pressure and pulse will be checked.  A final chest X-ray is taken to check the placement of the port and that there is no injury to your lung.  If there are no problems, you should be able to go home after the  procedure.  Document Released: 09/01/2013 Document Reviewed: 07/19/2013 [facility] Patient Information 2014 [place], [place].

## 2013-12-22 NOTE — Progress Notes (Signed)
Patient ID: Regina Martinez, female   DOB: 01/02/1961, 53 y.o.   MRN: [id number]  No chief complaint on file.   HPI Regina Martinez is a 53 y.o. female.   HPI pt sent at the request of Vicenta Aly, FNP for left breast cancer. Pt has had no symptoms.  No pain, mass or other symptom. Pt does have family history.     Past Medical History  Diagnosis Date  . Anxiety     Past Surgical History  Procedure Laterality Date  . Cesarean section      Family History  Problem Relation Age of Onset  . Breast cancer Mother   . Breast cancer Sister     Social History History  Substance Use Topics  . Smoking status: Never Smoker   . Smokeless tobacco: Not on file  . Alcohol Use: No    No Known Allergies  Current Outpatient Prescriptions  Medication Sig Dispense Refill  . Calcium Carb-Cholecalciferol (CALCIUM 600 + D PO) Take 1 tablet by mouth daily.      . Cholecalciferol (VITAMIN D-3 PO) Take 2,000 Int'l Units by mouth daily.      Marland Kitchen HYDROcodone-homatropine (HYCODAN) 5-1.5 MG/5ML syrup Take 5 mLs by mouth every 6 (six) hours as needed for cough.      Marland Kitchen ibuprofen (ADVIL,MOTRIN) 200 MG tablet Take 400 mg by mouth every 6 (six) hours as needed. For pain      . ondansetron (ZOFRAN) 4 MG tablet Take 1 tablet (4 mg total) by mouth every 6 (six) hours.  12 tablet  0  . oxyCODONE-acetaminophen (PERCOCET/ROXICET) 5-325 MG per tablet Take 1-2 tablets by mouth every 6 (six) hours as needed for pain.  20 tablet  0   No current facility-administered medications for this visit.    Review of Systems Review of Systems  Constitutional: Negative for fever, chills and unexpected weight change.  HENT: Negative for congestion, hearing loss, sore throat, trouble swallowing and voice change.   Eyes: Negative for visual disturbance.  Respiratory: Negative for cough and wheezing.   Cardiovascular: Negative for chest pain, palpitations and leg swelling.  Gastrointestinal: Negative for nausea, vomiting,  abdominal pain, diarrhea, constipation, blood in stool, abdominal distention and anal bleeding.  Genitourinary: Negative for hematuria, vaginal bleeding and difficulty urinating.  Musculoskeletal: Negative for arthralgias.  Skin: Negative for rash and wound.  Neurological: Negative for seizures, syncope and headaches.  Hematological: Negative for adenopathy. Does not bruise/bleed easily.  Psychiatric/Behavioral: Negative for confusion.    There were no vitals taken for this visit.  Physical Exam Physical Exam  Constitutional: She is oriented to person, place, and time. She appears well-developed and well-nourished.  HENT:  Head: Normocephalic and atraumatic.  Eyes: Pupils are equal, round, and reactive to light. No scleral icterus.  Neck: Normal range of motion. Neck supple.  Cardiovascular: Normal rate and regular rhythm.   Pulmonary/Chest: Right breast exhibits no inverted nipple, no mass, no nipple discharge, no skin change and no tenderness. Left breast exhibits no inverted nipple, no mass, no nipple discharge, no skin change and no tenderness. Breasts are symmetrical.    Musculoskeletal: Normal range of motion.  Lymphadenopathy:    She has no cervical adenopathy.    She has no axillary adenopathy.  Neurological: She is alert and oriented to person, place, and time.  Skin: Skin is warm and dry.  Psychiatric: She has a normal mood and affect. Her behavior is normal. Judgment and thought content normal.    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 Benign left  axillary lymph node biopsy.  EXAM:  BILATERAL BREAST MRI WITH AND WITHOUT CONTRAST  LABS: None  TECHNIQUE:  Multiplanar, multisequence MR images of both breasts were obtained  prior to and following the intravenous administration of 93ml of  MultiHance.  THREE-DIMENSIONAL  MR IMAGE RENDERING ON INDEPENDENT WORKSTATION:  Three-dimensional MR images were rendered by post-processing of the  original MR data on an independent workstation. The  three-dimensional MR images were interpreted, and findings are  reported in the following complete MRI report for this study. Three  dimensional images were evaluated at the independent DynaCad  workstation  COMPARISON: Previous exams, CT abdomen pelvis 12/17/2012  FINDINGS:  Breast composition: b. Scattered fibroglandular tissue.  Background parenchymal enhancement: Minimal  Right breast: No mass or abnormal enhancement.  Left breast: In the left breast far posterior 3 o'clock location,  there is a 2 cm area of clumped nodular enhancement with minimal  plateau type enhancement kinetics, and associated clip artifact,  corresponding to the areas of biopsy proven DCIS/invasive ductal  carcinoma. No other dominant area of abnormal enhancement is seen  elsewhere in either breast.  Lymph nodes: No lymphadenopathy is identified, with minimal cortical  prominence of normal-sized left axillary lymph nodes reidentified.  One of these with minimal eccentric cortical thickening likely  corresponds to a recently biopsied benign left axillary lymph node.  Ancillary findings: At incomplete imaging of the upper abdomen,  there is a 5 mm nonenhancing T2 hyperintense mass in the lateral  segment left hepatic lobe most likely a cyst. This is not visible on  the prior dissimilar exam, possibly due to its small size.  IMPRESSION:  2 cm area of minimal clumped nodular enhancement with plateau type  enhancement kinetics at the site of biopsy proven invasive ductal  carcinoma/DCIS. Allowing for the subtlety of this finding, there is  no MRI evidence for multifocal/ multicentric or contralateral  malignancy.  RECOMMENDATION:  Treatment plan  BI-RADS CATEGORY 6: Known biopsy-proven malignancy - appropriate  action should be taken.    Electronically Signed  By: Conchita Paris M.D.   Path  Two foci  Left breast uoq total area 1.4 cm  ER/PR POS  HER 2 NEU POS  Assessment    STAGE 1 LEFT BREAST CANCER    Plan    LEFT LUMPECTOMY LEFT SLN  MAPPING AND PORT PLACEMENT.  Risk of port placement include  Bleeding infection,  ptx,  organ injury, death,  Malfunction,  Migration,  And other procedures. The procedure has been discussed with the patient. Alternatives to surgery have been discussed with the patient.  Risks of surgery include bleeding,  Infection,  Seroma formation, death,  and the need for further surgery.   The patient understands and wishes to proceed.Sentinel lymph node mapping and dissection has been discussed with the patient.  Risk of bleeding,  Infection,  Seroma formation,  Additional procedures,,  Shoulder weakness ,  Shoulder stiffness,  Nerve and blood vessel injury and reaction to the mapping dyes have been discussed.  Alternatives to surgery have been discussed with the patient.  The patient agrees to proceed.       Shadman Tozzi A. 12/22/2013, 10:54 AM

## 2013-12-22 NOTE — Progress Notes (Signed)
Checked in new patient with no financial issues.she has appt card and [facility] form. [place]line Rough [place].

## 2013-12-22 NOTE — Progress Notes (Signed)
Radiation Oncology         [phone] ________________________________  Initial outpatient Consultation - Date: 12/22/2013   Name: [place] MRN: [id number]   DOB: November 11, 1961  REFERRING PHYSICIAN: Turner [place]., MD  DIAGNOSIS: [id number] Left Breast Cancer  HISTORY OF PRESENT ILLNESS::[place] is a 53 y.o. female who underwent a screening mammogram in October. Followup is recommended for calcifications. She eventually followed up in consultation for noted in the upper outer quadrants. She will underwent ultrasound which showed an 8 mm and 4 mm area of concern which were 1.4 cm apart. An enlarged lymph node was also noted and was biopsied. Biopsy of the 2 lesions in the breast showed similar appearing grade 2-3 invasive ductal carcinoma which was ER positive PR positive HER-2 positive with a Ki-67 of 20 and 20% respectively. The lymph node was benign. MRI of the bilateral breasts showed a 2 cm area of enhancement in the area of concern. No findings were noted in the right breast. No enlarged lymph nodes. He is recovered well from her biopsy and has some soreness in her breast. She does have a strong family history of breast cancer in a sister who was diagnosed in her 47s and a mother who died at 51 breast cancer. She did grow up in [place] moved to the [place] she was 18. Her family history is really not discussed with her so she is unsure of any female relatives or any relatives other than her mother and her sister having cancer. She is accompanied by a close friend. Her husband lives full-time in [place] as a [place] for the [place] [employer]. [place] daughter lives in [place] and another daughter lives in [place]. She has a teenage son who lives with her at home. She is GX P3 with her menses at 12. She has not had a menstrual cycle in 2 years. She has not been on hormone replacement. Marland Kitchen  PREVIOUS RADIATION THERAPY: No  PAST MEDICAL HISTORY:  has no past medical history on  file.    PAST SURGICAL HISTORY: Past Surgical History  Procedure Laterality Date  . Cesarean section      FAMILY HISTORY: No family history on file.  SOCIAL HISTORY:  History  Substance Use Topics  . Smoking status: Never Smoker   . Smokeless tobacco: Not on file  . Alcohol Use: No    ALLERGIES: Review of patient's allergies indicates no known allergies.  MEDICATIONS:  Current Outpatient Prescriptions  Medication Sig Dispense Refill  . Calcium Carb-Cholecalciferol (CALCIUM 600 + D PO) Take 1 tablet by mouth daily.      . Cholecalciferol (VITAMIN D-3 PO) Take 2,000 Int'l Units by mouth daily.      Marland Kitchen HYDROcodone-homatropine (HYCODAN) 5-1.5 MG/5ML syrup Take 5 mLs by mouth every 6 (six) hours as needed for cough.      Marland Kitchen ibuprofen (ADVIL,MOTRIN) 200 MG tablet Take 400 mg by mouth every 6 (six) hours as needed. For pain      . ondansetron (ZOFRAN) 4 MG tablet Take 1 tablet (4 mg total) by mouth every 6 (six) hours.  12 tablet  0  . oxyCODONE-acetaminophen (PERCOCET/ROXICET) 5-325 MG per tablet Take 1-2 tablets by mouth every 6 (six) hours as needed for pain.  20 tablet  0   No current facility-administered medications for this encounter.    REVIEW OF SYSTEMS:  A 15 point review of systems is documented in the electronic medical record. This was obtained by the nursing  staff. However, I reviewed this with the patient to discuss relevant findings and make appropriate changes.  Pertinent items are noted in HPI.  PHYSICAL EXAM: There were no vitals filed for this visit.. . She is a pleasant female in no distress. She has large pendulous breasts bilaterally. She has some bruising in the upper outer quadrant of the left breast and some tenderness there. No palpable abnormalities. No palpable or visible abnormalities of the right breast. No palpable axillary adenopathy bilaterally. She is alert and oriented x3. Cranial nerves II through XII are tested and intact. She has 5 out of 5 strength  bilaterally.  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 Benign left axillary lymph node biopsy.  EXAM: BILATERAL BREAST MRI WITH AND WITHOUT CONTRAST  LABS:  None  TECHNIQUE: Multiplanar, multisequence MR images of both breasts were obtained prior to and following the intravenous administration of 30m of MultiHance.  THREE-DIMENSIONAL MR IMAGE RENDERING ON INDEPENDENT WORKSTATION:  Three-dimensional MR images were rendered by post-processing of the original MR data on an independent workstation. The three-dimensional MR images were interpreted, and findings are reported in the following complete MRI report for this study. Three dimensional images were evaluated at the independent DynaCad workstation  COMPARISON:  Previous exams, CT abdomen pelvis 12/17/2012  FINDINGS: Breast composition: b.  Scattered fibroglandular tissue.  Background parenchymal enhancement: Minimal  Right breast: No mass or abnormal enhancement.  Left breast: In the left breast far posterior 3 o'clock location, there is a 2 cm area of clumped nodular enhancement with minimal plateau type enhancement kinetics, and associated clip artifact, corresponding to the areas of biopsy proven DCIS/invasive ductal carcinoma. No other dominant area of abnormal enhancement is seen elsewhere in either breast.  Lymph nodes: No lymphadenopathy is identified, with  minimal cortical prominence of normal-sized left axillary lymph nodes reidentified. One of these with minimal eccentric cortical thickening likely corresponds to a recently biopsied benign left axillary lymph node.  Ancillary findings: At incomplete imaging of the upper abdomen, there is a 5 mm nonenhancing T2 hyperintense mass in the lateral segment left hepatic lobe most likely a cyst. This is not visible on the prior dissimilar exam, possibly due to its small size.  IMPRESSION: 2 cm area of minimal clumped nodular enhancement with plateau type enhancement kinetics at the site of biopsy proven invasive ductal carcinoma/DCIS. Allowing for the subtlety of this finding, there is no MRI evidence for multifocal/ multicentric or contralateral malignancy.  RECOMMENDATION: Treatment plan  BI-RADS CATEGORY  6: Known biopsy-proven malignancy - appropriate action should be taken.   Electronically Signed   By: GConchita ParisM.D.   On: 12/21/2013 10:38   Mm Digital Diagnostic Unilat L  12/10/2013   CLINICAL DATA:  Status post ultrasound-guided core needle  biopsy of two adjacent suspicious left breast masses  EXAM: DIAGNOSTIC LEFT MAMMOGRAM POST ULTRASOUND BIOPSY  COMPARISON:  Previous exams  FINDINGS: Mammographic images were obtained following ultrasound guided biopsy of two adjacent suspicious left breast masses. Note the initially biopsied smaller mass contains a ribbon shaped clip and the subsequently biopsied larger mass contains a heart shaped clip.  IMPRESSION: Appropriate position of biopsy marking clips within the left breast status post ultrasound-guided core needle biopsy.  Final Assessment: Post Procedure Mammograms for Marker Placement   Electronically Signed   By: Lovey Newcomer M.D.   On: 12/10/2013 17:10   Mm Digital Diagnostic Unilat L  11/29/2013   CLINICAL DATA:  Patient was recalled from screening for left breast mass.  EXAM: DIGITAL DIAGNOSTIC  LEFT MAMMOGRAM  ULTRASOUND LEFT BREAST  COMPARISON:  None   ACR Breast Density Category c: The breasts are heterogeneously dense, which may obscure small masses.  FINDINGS: Within the left breast 3 o'clock position posterior depth there is a 1.2 cm irregular mass which persists on additional spot-compression magnification views. No definite calcifications are demonstrated within this mass.  On physical exam I palpate no discrete mass within the lateral aspect of the left breast.  Ultrasound is performed, showing a 0.8 x 0.6 x 0.8 cm irregular hypoechoic mass within the left breast 3 o'clock position 15 cm from the nipple. Additionally there is an enlarged lymph node within the left axilla.  IMPRESSION: Suspicious left breast mass with enlarged left axillary lymph node.  RECOMMENDATION: Ultrasound-guided core needle biopsy suspicious left breast mass and enlarged left axillary lymph node  Scheduled for December 09, 2013 at 8 a.m.  I have discussed the findings and recommendations with the patient. Results were also provided in writing at the conclusion of the visit. If applicable, a reminder letter will be sent to the patient regarding the next appointment.  BI-RADS CATEGORY  5: Highly suggestive of malignancy - appropriate action should be taken.   Electronically Signed   By: Lovey Newcomer M.D.   On: 11/29/2013 09:38   Mm Radiologist Eval And Mgmt  12/14/2013   EXAM: ESTABLISHED PATIENT OFFICE VISIT - LEVEL II ([id number])  EXAM: At physical exam, there is mild ecchymosis at the left axillary and left breast 3 o'clock location biopsy sites. The incisions are otherwise clean and dry.  HISTORY OF PRESENT ILLNESS: Biopsy was recommended for an irregular left breast mass 3 o'clock location with an adjacent satellite nodule and abnormal left axillary lymph node.  CHIEF COMPLAINT: The patient underwent ultrasound-guided core biopsy of 2 locations in the left breast 3 o'clock location and left axilla 2015 and presents today for discussion of her biopsy results.  PATHOLOGY: Pathology  demonstrates invasive ductal carcinoma and DCIS at both locations in the left breast 3 o'clock location but a benign left axillary lymph node.  ASSESSMENT AND PLAN: ASSESSMENT AND PLAN Treatment plan is recommended and breast MRI has been scheduled 12/21/2013 at 8:45 a.m. [facility] has been scheduled 12/22/2013. The patient may have a schedule conflict with her scheduled MRI 12/21/2013 but she is encouraged to re-schedule her MRI appointment if at all possible prior to her clinic appointment. All questions were answered. I also discussed these findings and recommendations with Susie, the daughter of the patient, by telephone at the patient's request.   Electronically Signed   By: Conchita Paris M.D.   On: 12/14/2013 15:10   US Breast Complete Uni Left Inc Axilla  11/29/2013   CLINICAL DATA:  Patient was recalled from screening for left breast mass.  EXAM: DIGITAL DIAGNOSTIC  LEFT MAMMOGRAM  ULTRASOUND LEFT BREAST  COMPARISON:  None  ACR Breast Density Category c: The breasts are heterogeneously dense, which may obscure small masses.  FINDINGS: Within the left breast 3 o'clock position posterior depth there is a 1.2 cm irregular mass which persists on additional spot-compression magnification views. No definite calcifications are demonstrated within this mass.  On physical exam I palpate no discrete mass within the lateral aspect of the left breast.  Ultrasound is performed, showing a 0.8 x 0.6 x 0.8 cm irregular hypoechoic mass within the left breast 3 o'clock position 15 cm from the nipple. Additionally there is an enlarged lymph node within the left axilla.  IMPRESSION: Suspicious left breast mass with enlarged left axillary lymph node.  RECOMMENDATION: Ultrasound-guided core needle biopsy suspicious left breast mass and enlarged left axillary lymph node  Scheduled for December 09, 2013 at 8 a.m.  I have discussed the findings and recommendations with the  patient. Results were also provided in writing at the conclusion of the visit. If applicable, a reminder letter will be sent to the patient regarding the next appointment.  BI-RADS CATEGORY  5: Highly suggestive of malignancy - appropriate action should be taken.   Electronically Signed   By: Lovey Newcomer M.D.   On: 11/29/2013 09:38   [place] Lt Breast Bx W Loc Dev 1st Lesion Img Bx Spec US Guide  12/10/2013   CLINICAL DATA:  Patient with suspicious left breast mass and left axillary lymphadenopathy.  EXAM: ULTRASOUND GUIDED LEFT BREAST CORE NEEDLE BIOPSY WITH VACUUM ASSIST  COMPARISON:  Previous exams.  PROCEDURE: I met with the patient and we discussed the procedure of ultrasound-guided biopsy, including benefits and alternatives. We discussed the high likelihood of a successful procedure. We discussed the risks of the procedure including infection, bleeding, tissue injury, clip migration, and inadequate sampling. Informed written consent was given. The usual time-out protocol was performed immediately prior to the procedure.  While performing the ultrasound to localize the left breast lesion it was noted that there is a separate 0.4 cm irregular hypoechoic lesion just adjacent to the larger previously described mass within the left breast 3 o'clock position. These masses are approximately 1 cm apart. This mass will be biopsied as well as the larger previously described mass, both located within the 3 o'clock position left breast.  Lesion 1/smaller mass (ribbon clip):  Using sterile technique and 2% Lidocaine as local anesthetic, under direct ultrasound visualization, a 12 gauge vacuum-assisteddevice was used to perform biopsy of suspicious hypoechoic mass within the left breast using a lateral approach. At the conclusion of the procedure, a ribbon shaped tissue marker clip was deployed into the biopsy cavity. Follow-up 2-view mammogram was performed and dictated separately.  Lesion 2/larger mass (heart shaped):   Using sterile technique and 2% Lidocaine as local anesthetic, under direct ultrasound visualization, a 12 gauge vacuum-assisteddevice was used to perform biopsy of suspicious hypoechoic mass within the left breast using a lateral approach. At the conclusion of the procedure, a heart shaped tissue marker clip was deployed into the biopsy cavity. Follow-up 2-view mammogram was performed and dictated separately.  Lesion 3 (left axillary lymph node):  Using sterile technique and 2% Lidocaine as local anesthetic, under direct ultrasound visualization, a 12 gauge vacuum-assisteddevice was used to perform biopsy of enlarged left axillary lymph node using a lateral approach. No clip was placed within  the lymph node.  IMPRESSION: Ultrasound-guided biopsy of two suspicious left breast masses and enlarged left axillary lymph node. No apparent complications.  Note while localizing the initially described larger suspicious left breast mass a separate smaller adjacent suspicious mass was identified and both of these masses were biopsied separately. They are located approximately 1 cm apart.   Electronically Signed   By: Lovey Newcomer M.D.   On: 12/10/2013 17:09   [place] Lt Breast Bx W Loc Dev Ea Add Lesion Img Bx Spec US Guide  12/10/2013   CLINICAL DATA:  Patient with suspicious left breast mass and left axillary lymphadenopathy.  EXAM: ULTRASOUND GUIDED LEFT BREAST CORE NEEDLE BIOPSY WITH VACUUM ASSIST  COMPARISON:  Previous exams.  PROCEDURE: I met with the patient and we discussed the procedure of ultrasound-guided biopsy, including benefits and alternatives. We discussed the high likelihood of a successful procedure. We discussed the risks of the procedure including infection, bleeding, tissue injury, clip migration, and inadequate sampling. Informed written consent was given. The usual time-out protocol was performed immediately prior to the procedure.  While performing the ultrasound to localize the left breast lesion it was  noted that there is a separate 0.4 cm irregular hypoechoic lesion just adjacent to the larger previously described mass within the left breast 3 o'clock position. These masses are approximately 1 cm apart. This mass will be biopsied as well as the larger previously described mass, both located within the 3 o'clock position left breast.  Lesion 1/smaller mass (ribbon clip):  Using sterile technique and 2% Lidocaine as local anesthetic, under direct ultrasound visualization, a 12 gauge vacuum-assisteddevice was used to perform biopsy of suspicious hypoechoic mass within the left breast using a lateral approach. At the conclusion of the procedure, a ribbon shaped tissue marker clip was deployed into the biopsy cavity. Follow-up 2-view mammogram was performed and dictated separately.  Lesion 2/larger mass (heart shaped):  Using sterile technique and 2% Lidocaine as local anesthetic, under direct ultrasound visualization, a 12 gauge vacuum-assisteddevice was used to perform biopsy of suspicious hypoechoic mass within the left breast using a lateral approach. At the conclusion of the procedure, a heart shaped tissue marker clip was deployed into the biopsy cavity. Follow-up 2-view mammogram was performed and dictated separately.  Lesion 3 (left axillary lymph node):  Using sterile technique and 2% Lidocaine as local anesthetic, under direct ultrasound visualization, a 12 gauge vacuum-assisteddevice was used to perform biopsy of enlarged left axillary lymph node using a lateral approach. No clip was placed within the lymph node.  IMPRESSION: Ultrasound-guided biopsy of two suspicious left breast masses and enlarged left axillary lymph node. No apparent complications.  Note while localizing the initially described larger suspicious left breast mass a separate smaller adjacent suspicious mass was identified and both of these masses were biopsied separately. They are located approximately 1 cm apart.   Electronically Signed    By: Lovey Newcomer M.D.   On: 12/10/2013 17:09   [place] Lt Breast Bx W Loc Dev Ea Add Lesion Img Bx Spec US Guide  12/10/2013   CLINICAL DATA:  Patient with suspicious left breast mass and left axillary lymphadenopathy.  EXAM: ULTRASOUND GUIDED LEFT BREAST CORE NEEDLE BIOPSY WITH VACUUM ASSIST  COMPARISON:  Previous exams.  PROCEDURE: I met with the patient and we discussed the procedure of ultrasound-guided biopsy, including benefits and alternatives. We discussed the high likelihood of a successful procedure. We discussed the risks of the procedure including infection, bleeding, tissue injury, clip migration,  and inadequate sampling. Informed written consent was given. The usual time-out protocol was performed immediately prior to the procedure.  While performing the ultrasound to localize the left breast lesion it was noted that there is a separate 0.4 cm irregular hypoechoic lesion just adjacent to the larger previously described mass within the left breast 3 o'clock position. These masses are approximately 1 cm apart. This mass will be biopsied as well as the larger previously described mass, both located within the 3 o'clock position left breast.  Lesion 1/smaller mass (ribbon clip):  Using sterile technique and 2% Lidocaine as local anesthetic, under direct ultrasound visualization, a 12 gauge vacuum-assisteddevice was used to perform biopsy of suspicious hypoechoic mass within the left breast using a lateral approach. At the conclusion of the procedure, a ribbon shaped tissue marker clip was deployed into the biopsy cavity. Follow-up 2-view mammogram was performed and dictated separately.  Lesion 2/larger mass (heart shaped):  Using sterile technique and 2% Lidocaine as local anesthetic, under direct ultrasound visualization, a 12 gauge vacuum-assisteddevice was used to perform biopsy of suspicious hypoechoic mass within the left breast using a lateral approach. At the conclusion of the procedure, a heart shaped  tissue marker clip was deployed into the biopsy cavity. Follow-up 2-view mammogram was performed and dictated separately.  Lesion 3 (left axillary lymph node):  Using sterile technique and 2% Lidocaine as local anesthetic, under direct ultrasound visualization, a 12 gauge vacuum-assisteddevice was used to perform biopsy of enlarged left axillary lymph node using a lateral approach. No clip was placed within the lymph node.  IMPRESSION: Ultrasound-guided biopsy of two suspicious left breast masses and enlarged left axillary lymph node. No apparent complications.  Note while localizing the initially described larger suspicious left breast mass a separate smaller adjacent suspicious mass was identified and both of these masses were biopsied separately. They are located approximately 1 cm apart.   Electronically Signed   By: Lovey Newcomer M.D.   On: 12/10/2013 17:09      IMPRESSION: T1 C. N0 invasive ductal carcinoma of the left breast  PLAN: I discussed with the patient and her good friend today who is also been through breast cancer treatment here the options for treatment. We discussed the equivalency in terms of survival between mastectomy and lumpectomy. We discussed the need for sentinel lymph node. We discussed the role of radiation and decreasing local failures in patients who undergo breast conservation. We discussed the process of simulation the placement tattoos. We discussed 6 weeks of daily treatment as an outpatient with fatigue and skin redness pain was common side effects. We discussed the need for chemotherapy and the placement of a port. We discussed the need for antiestrogen therapy. She will meet with physical therapy and a member of our patient family support staff. I will see her back on her chemotherapy is complete.  I spent 60 minutes  face to face with the patient and more than 50% of that time was spent in counseling and/or coordination of care.     ------------------------------------------------  Thea Silversmith, MD

## 2013-12-23 ENCOUNTER — Other Ambulatory Visit: Payer: No Typology Code available for payment source

## 2013-12-23 ENCOUNTER — Ambulatory Visit (HOSPITAL_BASED_OUTPATIENT_CLINIC_OR_DEPARTMENT_OTHER): Payer: No Typology Code available for payment source | Admitting: Genetic Counselor

## 2013-12-23 ENCOUNTER — Telehealth: Payer: Self-pay | Admitting: Oncology

## 2013-12-23 ENCOUNTER — Encounter: Payer: Self-pay | Admitting: Genetic Counselor

## 2013-12-23 DIAGNOSIS — Z803 Family history of malignant neoplasm of breast: Secondary | ICD-10-CM

## 2013-12-23 DIAGNOSIS — IMO0002 Reserved for concepts with insufficient information to code with codable children: Secondary | ICD-10-CM

## 2013-12-23 DIAGNOSIS — C50419 Malignant neoplasm of upper-outer quadrant of unspecified female breast: Secondary | ICD-10-CM

## 2013-12-23 DIAGNOSIS — C50412 Malignant neoplasm of upper-outer quadrant of left female breast: Secondary | ICD-10-CM

## 2013-12-23 NOTE — Telephone Encounter (Signed)
, °

## 2013-12-23 NOTE — Progress Notes (Signed)
Dr.  Marcy Panning requested a consultation for genetic counseling and risk assessment for Regina Martinez, a 53 y.o. female, for discussion of her personal and family history of breast cancer.  Regina Martinez presents to clinic today to discuss the possibility of a genetic predisposition to cancer, and to further clarify her risks, as well as her family members' risks for cancer.   HISTORY OF PRESENT ILLNESS: In 2015, at the age of 62, Regina Martinez was diagnosed with invasive ductal carcinoma of the breast. This will be treated with surgery, and will depend on the results of the testing.  Regina Martinez may also undergo chemotherapy and radiation.  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    Past Medical History  Diagnosis Date  . Anxiety   . Breast cancer 2015    triple positive    Past Surgical History  Procedure Laterality Date  . Cesarean section      History   Social History  . Marital Status: Married    Spouse Name: N/A    Number of Children: 3  . Years of Education: N/A   Occupational History  .  [employer]   Social History Main Topics  . Smoking status: Never Smoker   . Smokeless tobacco: None  . Alcohol Use: No  . Drug Use: No  . Sexual Activity: Yes   Other Topics Concern  . None   Social History Narrative  . None    REPRODUCTIVE HISTORY AND PERSONAL RISK ASSESSMENT FACTORS: Menarche was at age 56.   postmenopausal Uterus Intact: yes Ovaries Intact: yes G3P3A0, first live birth at age 60  Regina Martinez has not previously undergone treatment for infertility.   Oral Contraceptive use: 0 years   Regina Martinez has not used HRT in the past.    FAMILY HISTORY:  We obtained a detailed, 4-generation family history.  Significant diagnoses are listed below: Family History  Problem Relation Age of Onset  . Breast cancer Mother 57  . Breast cancer Sister 66    Patient's ancestors are of Afghani descent. There is nmo reported Ashkenazi Jewish ancestry. There is no known consanguinity.  GENETIC COUNSELING  ASSESSMENT: Regina Martinez is a 53 y.o. female with a personal and family history of breast cancer which somewhat suggestive of a hereditary breast cancer syndrome and predisposition to cancer. We, therefore, discussed and recommended the following at today's visit.   DISCUSSION: We reviewed the characteristics, features and inheritance patterns of hereditary cancer syndromes. We also discussed genetic testing, including the appropriate family members to test, the process of testing, insurance coverage and turn-around-time for results. We reviewed BRCA mutations and the risks involved with those genes.  Based on her mother's diagnosis and death at age 53 from breast cancer, there is a small chance that there could be a TP53 mutation.  Surgery will depend on her genetic test results, and therefore Regina Martinez wants to undergo genetic testing today.  PLAN: After considering the risks, benefits, and limitations, Regina Martinez provided informed consent to pursue genetic testing and the blood sample will be sent to [facility] for analysis of the Breast/Ovarian cancer panel. We discussed the implications of a positive, negative and/ or variant of uncertain significance genetic test result. Results should be available within approximately 3 weeks' time, at which point they will be disclosed by telephone to Regina Martinez, as will any additional recommendations warranted by these results. Regina Martinez will receive a summary of her genetic counseling visit and a copy of her results once available. This  information will also be available in Epic. We encouraged Regina Martinez to remain in contact with cancer genetics annually so that we can continuously update the family history and inform her of any changes in cancer genetics and testing that may be of benefit for her family. Regina Martinez's questions were answered to her satisfaction today. Our contact information was provided should additional questions or concerns  arise.  The patient was seen for a total of 60 minutes, greater than 50% of which was spent face-to-face counseling.  This note will also be sent to the referring provider via the electronic medical record. The patient will be supplied with a summary of this genetic counseling discussion as well as educational information on the discussed hereditary cancer syndromes following the conclusion of their visit.   Patient was discussed with Dr. Marcy Panning.   _______________________________________________________________________ For Office Staff:  Number of people involved in session: 2 Was an Intern/ student involved with case: no

## 2013-12-27 ENCOUNTER — Encounter: Payer: Self-pay | Admitting: *Deleted

## 2013-12-27 ENCOUNTER — Telehealth: Payer: Self-pay | Admitting: *Deleted

## 2013-12-27 NOTE — Progress Notes (Signed)
[facility] Psychosocial Distress Screening Clinical Social Work  Patient completed distress screening protocol, and patient scored a 10 on the Psychosocial Distress Thermometer which indicates severe distress. Clinical Social Worker met with pt in [facility] to assess for distress and other psychosocial needs.  Pt stated she was doing "better" and her level of distress had decreased after meeting with the multidisciplinary team and getting more information on her treatment plan.  Pt's largest concerns was her family and being alone at home with her youngest child (currenlty in the 9th grade).  CSW validated pt's feelings and concerns, and informed pt of the support team and support services at [facility].  Pt's friend/neighbor was with her at [facility] and identifies as a means of support for pt.  Pt's friend reported that she is also a nine year survivor.  CSW encouraged pt to call with any questions or concerns.    Johnnye Lana, MSW, [place] Worker [facility] [phone]

## 2013-12-27 NOTE — Telephone Encounter (Signed)
Faxed Care Plan to PCP and took to Med Rec to scan.  

## 2013-12-29 ENCOUNTER — Telehealth: Payer: Self-pay | Admitting: *Deleted

## 2013-12-29 NOTE — Telephone Encounter (Signed)
Left vm for pt to return call regarding [place] from 12/22/13.

## 2014-01-04 ENCOUNTER — Telehealth: Payer: Self-pay | Admitting: Genetic Counselor

## 2014-01-04 ENCOUNTER — Encounter: Payer: Self-pay | Admitting: Genetic Counselor

## 2014-01-04 NOTE — Telephone Encounter (Signed)
Revealed negative genetic test results 

## 2014-01-12 ENCOUNTER — Ambulatory Visit (HOSPITAL_COMMUNITY)
Admission: RE | Admit: 2014-01-12 | Discharge: 2014-01-12 | Disposition: A | Discharge status: Home / Self Care | Payer: No Typology Code available for payment source | Source: Ambulatory Visit | Attending: Oncology | Admitting: Oncology

## 2014-01-12 ENCOUNTER — Encounter (HOSPITAL_BASED_OUTPATIENT_CLINIC_OR_DEPARTMENT_OTHER): Payer: Self-pay | Admitting: *Deleted

## 2014-01-12 ENCOUNTER — Encounter (HOSPITAL_BASED_OUTPATIENT_CLINIC_OR_DEPARTMENT_OTHER)
Admission: RE | Admit: 2014-01-12 | Discharge: 2014-01-12 | Disposition: A | Discharge status: Home / Self Care | Payer: No Typology Code available for payment source | Source: Ambulatory Visit | Attending: Surgery | Admitting: Surgery

## 2014-01-12 ENCOUNTER — Ambulatory Visit (HOSPITAL_BASED_OUTPATIENT_CLINIC_OR_DEPARTMENT_OTHER)
Admission: RE | Admit: 2014-01-12 | Discharge: 2014-01-12 | Disposition: A | Discharge status: Home / Self Care | Payer: No Typology Code available for payment source | Source: Ambulatory Visit | Attending: Cardiology | Admitting: Cardiology

## 2014-01-12 VITALS — BP 104/68 | HR 60 | Wt 152.2 lb

## 2014-01-12 DIAGNOSIS — Z01812 Encounter for preprocedural laboratory examination: Secondary | ICD-10-CM | POA: Insufficient documentation

## 2014-01-12 DIAGNOSIS — I519 Heart disease, unspecified: Secondary | ICD-10-CM

## 2014-01-12 DIAGNOSIS — C50412 Malignant neoplasm of upper-outer quadrant of left female breast: Secondary | ICD-10-CM

## 2014-01-12 DIAGNOSIS — C50419 Malignant neoplasm of upper-outer quadrant of unspecified female breast: Secondary | ICD-10-CM

## 2014-01-12 DIAGNOSIS — C50919 Malignant neoplasm of unspecified site of unspecified female breast: Secondary | ICD-10-CM | POA: Insufficient documentation

## 2014-01-12 LAB — CBC WITH DIFFERENTIAL/PLATELET
Basophils Absolute: 0 10*3/uL (ref 0.0–0.1)
Basophils Relative: 0 % (ref 0–1)
EOS PCT: 3 % (ref 0–5)
Eosinophils Absolute: 0.2 10*3/uL (ref 0.0–0.7)
HEMATOCRIT: 39.6 % (ref 36.0–46.0)
HEMOGLOBIN: 14 g/dL (ref 12.0–15.0)
LYMPHS ABS: 2.2 10*3/uL (ref 0.7–4.0)
Lymphocytes Relative: 37 % (ref 12–46)
MCH: 31.9 pg (ref 26.0–34.0)
MCHC: 35.4 g/dL (ref 30.0–36.0)
MCV: 90.2 fL (ref 78.0–100.0)
MONO ABS: 0.3 10*3/uL (ref 0.1–1.0)
Monocytes Relative: 5 % (ref 3–12)
Neutro Abs: 3.2 10*3/uL (ref 1.7–7.7)
Neutrophils Relative %: 54 % (ref 43–77)
Platelets: 188 10*3/uL (ref 150–400)
RBC: 4.39 MIL/uL (ref 3.87–5.11)
RDW: 13 % (ref 11.5–15.5)
WBC: 5.9 10*3/uL (ref 4.0–10.5)

## 2014-01-12 LAB — COMPREHENSIVE METABOLIC PANEL
ALT: 15 U/L (ref 0–35)
AST: 18 U/L (ref 0–37)
Albumin: 4.1 g/dL (ref 3.5–5.2)
Alkaline Phosphatase: 50 U/L (ref 39–117)
BILIRUBIN TOTAL: 0.6 mg/dL (ref 0.3–1.2)
BUN: 11 mg/dL (ref 6–23)
CALCIUM: 9.4 mg/dL (ref 8.4–10.5)
CO2: 26 meq/L (ref 19–32)
CREATININE: 0.56 mg/dL (ref 0.50–1.10)
Chloride: 104 mEq/L (ref 96–112)
GFR calc Af Amer: >90 mL/min (ref >90)
GLUCOSE: 89 mg/dL (ref 70–99)
Potassium: 4.1 mEq/L (ref 3.7–5.3)
Sodium: 144 mEq/L (ref 137–147)
Total Protein: 7.1 g/dL (ref 6.0–8.3)

## 2014-01-12 NOTE — Progress Notes (Signed)
Patient ID: Regina Martinez, female   DOB: Nov 09, 1961, 53 y.o.   MRN: [id number] Oncologist: Dr. Humphrey Rolls  53 yo with newly diagnosed breast cancer presents for cardiac evaluation for Herceptin therapy.  Patient has been diagnosed with ER+,PR+,NER-2/neu+ breast cancer.  She will undergo lumpectomy later this month as well as 6 cycles Taxotere/carboplatin with weekly Herceptin followed by Herceptin every 3 wks x 1 year.  Currently, she is symptomatically stable.  She has no prior cardiac history.  No exertional dyspnea or chest pain.  She does all her housework, cooking, etc.  No history of syncope or lightheadedness.    I reviewed her echo today, there were no significant abnormalities.   PMH: 1. Breast cancer: ER+,PR+,NER-2/neu+.   2. Echocardiogram (2/15) with EF 55-60%, global longitudinal strain -22%, lateral s' 9.6 cm/sec, no significant valvular abnormalities.   SH: Nonsmoker, married, no ETOH.   FH: No history of cardiomyopathy or premature CAD.   ROS: All systems reviewed and negative except as per HPI.   Current Outpatient Prescriptions  Medication Sig Dispense Refill  . Calcium Carb-Cholecalciferol (CALCIUM 600 + D PO) Take 1 tablet by mouth daily.      . Cholecalciferol (VITAMIN D-3 PO) Take 2,000 Int'l Units by mouth daily.      . ondansetron (ZOFRAN) 4 MG tablet Take 1 tablet (4 mg total) by mouth every 6 (six) hours.  12 tablet  0   No current facility-administered medications for this encounter.    BP 104/68  Pulse 60  Wt 152 lb 4 oz (69.06 kg)  SpO2 98% General: NAD Neck: No JVD, no thyromegaly or thyroid nodule.  Lungs: Clear to auscultation bilaterally with normal respiratory effort. CV: Nondisplaced PMI.  Heart regular S1/S2, no S3/S4, no murmur.  No peripheral edema.  No carotid bruit.  Normal pedal pulses.  Abdomen: Soft, nontender, no hepatosplenomegaly, no distention.  Skin: Intact without lesions or rashes.  Neurologic: Alert and oriented x 3.  Psych: Normal  affect. Extremities: No clubbing or cyanosis.  HEENT: Normal.   Assessment/Plan: 53 yo with breast cancer presents for cardiac evaluation prior to initiating Herceptin.  I reviewed her baseline echo today, there are no significant abnormalities.  We reviewed the cardiac risk from Herceptin and the rationale for screening echoes.  Will have her back in 3 months with repeat echocardiogram.   Loralie Champagne 01/12/2014 5:13 PM

## 2014-01-12 NOTE — Progress Notes (Signed)
  Echocardiogram 2D Echocardiogram has been performed.  Regina Martinez 01/12/2014, 10:47 AM

## 2014-01-12 NOTE — Progress Notes (Signed)
Pt speaks english very well-her friend came with her-both their husbands in [place]. ccs labs done-all preop reviewed

## 2014-01-12 NOTE — Patient Instructions (Addendum)
We will contact you in 3 months to schedule your next appointment and echocardiogram  

## 2014-01-18 ENCOUNTER — Encounter (HOSPITAL_BASED_OUTPATIENT_CLINIC_OR_DEPARTMENT_OTHER): Payer: Self-pay | Admitting: Certified Registered"

## 2014-01-18 ENCOUNTER — Ambulatory Visit
Admission: RE | Admit: 2014-01-18 | Discharge: 2014-01-18 | Disposition: A | Discharge status: Home / Self Care | Payer: No Typology Code available for payment source | Source: Ambulatory Visit | Attending: Surgery | Admitting: Surgery

## 2014-01-18 ENCOUNTER — Ambulatory Visit (HOSPITAL_COMMUNITY)
Admission: RE | Admit: 2014-01-18 | Discharge: 2014-01-18 | Disposition: A | Discharge status: Home / Self Care | Payer: No Typology Code available for payment source | Source: Ambulatory Visit | Attending: Surgery | Admitting: Surgery

## 2014-01-18 ENCOUNTER — Ambulatory Visit (HOSPITAL_COMMUNITY): Payer: No Typology Code available for payment source

## 2014-01-18 ENCOUNTER — Ambulatory Visit (HOSPITAL_BASED_OUTPATIENT_CLINIC_OR_DEPARTMENT_OTHER): Payer: No Typology Code available for payment source | Admitting: Certified Registered"

## 2014-01-18 ENCOUNTER — Encounter (HOSPITAL_BASED_OUTPATIENT_CLINIC_OR_DEPARTMENT_OTHER): Payer: No Typology Code available for payment source | Admitting: Certified Registered"

## 2014-01-18 ENCOUNTER — Ambulatory Visit (HOSPITAL_BASED_OUTPATIENT_CLINIC_OR_DEPARTMENT_OTHER)
Admission: RE | Admit: 2014-01-18 | Discharge: 2014-01-18 | Disposition: A | Discharge status: Home / Self Care | Payer: No Typology Code available for payment source | Source: Ambulatory Visit | Attending: Surgery | Admitting: Surgery

## 2014-01-18 ENCOUNTER — Encounter (HOSPITAL_BASED_OUTPATIENT_CLINIC_OR_DEPARTMENT_OTHER)
Admission: RE | Disposition: A | Discharge status: Home / Self Care | Payer: Self-pay | Source: Ambulatory Visit | Attending: Surgery

## 2014-01-18 DIAGNOSIS — C50912 Malignant neoplasm of unspecified site of left female breast: Secondary | ICD-10-CM

## 2014-01-18 DIAGNOSIS — D0590 Unspecified type of carcinoma in situ of unspecified breast: Secondary | ICD-10-CM

## 2014-01-18 DIAGNOSIS — Z452 Encounter for adjustment and management of vascular access device: Secondary | ICD-10-CM | POA: Insufficient documentation

## 2014-01-18 DIAGNOSIS — C50919 Malignant neoplasm of unspecified site of unspecified female breast: Secondary | ICD-10-CM | POA: Insufficient documentation

## 2014-01-18 DIAGNOSIS — Z79899 Other long term (current) drug therapy: Secondary | ICD-10-CM | POA: Insufficient documentation

## 2014-01-18 DIAGNOSIS — F411 Generalized anxiety disorder: Secondary | ICD-10-CM | POA: Insufficient documentation

## 2014-01-18 HISTORY — PX: BREAST LUMPECTOMY WITH NEEDLE LOCALIZATION AND AXILLARY SENTINEL LYMPH NODE BX: SHX5760

## 2014-01-18 HISTORY — DX: Presence of spectacles and contact lenses: Z97.3

## 2014-01-18 HISTORY — PX: PORTACATH PLACEMENT: SHX2246

## 2014-01-18 SURGERY — BREAST LUMPECTOMY WITH NEEDLE LOCALIZATION AND AXILLARY SENTINEL LYMPH NODE BX
Anesthesia: General | Site: Chest | Laterality: Right

## 2014-01-18 MED ORDER — FENTANYL CITRATE 0.05 MG/ML IJ SOLN
50.0000 ug | INTRAMUSCULAR | Status: DC | PRN
  Administered 2014-01-18: 100 ug via INTRAVENOUS

## 2014-01-18 MED ORDER — SODIUM CHLORIDE 0.9 % IJ SOLN
INTRAMUSCULAR | Status: DC | PRN
  Administered 2014-01-18: 10:00:00

## 2014-01-18 MED ORDER — MIDAZOLAM HCL 2 MG/2ML IJ SOLN
INTRAMUSCULAR | Status: AC
  Filled 2014-01-18: qty 2

## 2014-01-18 MED ORDER — HEPARIN SOD (PORK) LOCK FLUSH 100 UNIT/ML IV SOLN
INTRAVENOUS | Status: AC
  Filled 2014-01-18: qty 5

## 2014-01-18 MED ORDER — BUPIVACAINE-EPINEPHRINE PF 0.25-1:200000 % IJ SOLN
INTRAMUSCULAR | Status: AC
  Filled 2014-01-18: qty 30

## 2014-01-18 MED ORDER — HYDROMORPHONE HCL PF 1 MG/ML IJ SOLN
INTRAMUSCULAR | Status: AC
Start: 2014-01-18 — End: 2014-01-18
  Filled 2014-01-18: qty 1

## 2014-01-18 MED ORDER — BUPIVACAINE-EPINEPHRINE 0.25% -1:200000 IJ SOLN
INTRAMUSCULAR | Status: DC | PRN
  Administered 2014-01-18: 10 mL

## 2014-01-18 MED ORDER — CHLORHEXIDINE GLUCONATE 4 % EX LIQD: 1.0000 "application " | Freq: Once | CUTANEOUS | Status: DC

## 2014-01-18 MED ORDER — HEPARIN SOD (PORK) LOCK FLUSH 100 UNIT/ML IV SOLN
INTRAVENOUS | Status: DC | PRN
  Administered 2014-01-18: 500 [IU] via INTRAVENOUS

## 2014-01-18 MED ORDER — LIDOCAINE HCL (CARDIAC) 20 MG/ML IV SOLN
INTRAVENOUS | Status: DC | PRN
  Administered 2014-01-18: 75 mg via INTRAVENOUS

## 2014-01-18 MED ORDER — FENTANYL CITRATE 0.05 MG/ML IJ SOLN
INTRAMUSCULAR | Status: DC | PRN
  Administered 2014-01-18: 25 ug via INTRAVENOUS
  Administered 2014-01-18: 75 ug via INTRAVENOUS

## 2014-01-18 MED ORDER — LACTATED RINGERS IV SOLN
INTRAVENOUS | Status: DC
  Administered 2014-01-18 (×3): via INTRAVENOUS

## 2014-01-18 MED ORDER — CEFAZOLIN SODIUM-DEXTROSE 2-3 GM-% IV SOLR
2.0000 g | INTRAVENOUS | Status: AC
  Administered 2014-01-18: 2 g via INTRAVENOUS

## 2014-01-18 MED ORDER — CEFAZOLIN SODIUM-DEXTROSE 2-3 GM-% IV SOLR
INTRAVENOUS | Status: AC
  Filled 2014-01-18: qty 50

## 2014-01-18 MED ORDER — PROMETHAZINE HCL 25 MG/ML IJ SOLN: 6.2500 mg | INTRAMUSCULAR | Status: DC | PRN

## 2014-01-18 MED ORDER — FENTANYL CITRATE 0.05 MG/ML IJ SOLN
INTRAMUSCULAR | Status: AC
  Filled 2014-01-18: qty 6

## 2014-01-18 MED ORDER — TECHNETIUM TC 99M SULFUR COLLOID FILTERED
1.0000 | Freq: Once | INTRAVENOUS | Status: AC | PRN
  Administered 2014-01-18: 1 via INTRADERMAL

## 2014-01-18 MED ORDER — HEPARIN (PORCINE) IN NACL 2-0.9 UNIT/ML-% IJ SOLN
INTRAMUSCULAR | Status: AC
  Filled 2014-01-18: qty 500

## 2014-01-18 MED ORDER — TRAMADOL HCL 50 MG PO TABS: 50.0000 mg | ORAL_TABLET | Freq: Four times a day (QID) | ORAL | Status: DC | PRN

## 2014-01-18 MED ORDER — HEPARIN (PORCINE) IN NACL 2-0.9 UNIT/ML-% IJ SOLN
INTRAMUSCULAR | Status: DC | PRN
  Administered 2014-01-18: 500 mL via INTRAVENOUS

## 2014-01-18 MED ORDER — MIDAZOLAM HCL 2 MG/ML PO SYRP: 2.0000 mg | ORAL_SOLUTION | Freq: Once | ORAL | Status: DC

## 2014-01-18 MED ORDER — FENTANYL CITRATE 0.05 MG/ML IJ SOLN
INTRAMUSCULAR | Status: AC
  Filled 2014-01-18: qty 2

## 2014-01-18 MED ORDER — EPHEDRINE SULFATE 50 MG/ML IJ SOLN
INTRAMUSCULAR | Status: DC | PRN
  Administered 2014-01-18 (×2): 10 mg via INTRAVENOUS

## 2014-01-18 MED ORDER — ONDANSETRON HCL 4 MG/2ML IJ SOLN
INTRAMUSCULAR | Status: DC | PRN
  Administered 2014-01-18: 4 mg via INTRAVENOUS

## 2014-01-18 MED ORDER — OXYCODONE HCL 5 MG/5ML PO SOLN: 5.0000 mg | Freq: Once | ORAL | Status: DC | PRN

## 2014-01-18 MED ORDER — OXYCODONE-ACETAMINOPHEN 5-325 MG PO TABS: 1.0000 | ORAL_TABLET | ORAL | Status: DC | PRN

## 2014-01-18 MED ORDER — METHYLENE BLUE 1 % INJ SOLN
INTRAMUSCULAR | Status: AC
  Filled 2014-01-18: qty 10

## 2014-01-18 MED ORDER — HYDROMORPHONE HCL PF 1 MG/ML IJ SOLN
0.2500 mg | INTRAMUSCULAR | Status: DC | PRN
  Administered 2014-01-18: 0.25 mg via INTRAVENOUS
  Administered 2014-01-18 (×3): 0.5 mg via INTRAVENOUS

## 2014-01-18 MED ORDER — SODIUM CHLORIDE 0.9 % IJ SOLN
INTRAMUSCULAR | Status: AC
  Filled 2014-01-18: qty 10

## 2014-01-18 MED ORDER — OXYCODONE HCL 5 MG PO TABS: 5.0000 mg | ORAL_TABLET | Freq: Once | ORAL | Status: DC | PRN

## 2014-01-18 MED ORDER — PROPOFOL 10 MG/ML IV BOLUS
INTRAVENOUS | Status: DC | PRN
Start: 2014-01-18 — End: 2014-01-18
  Administered 2014-01-18: 140 mg via INTRAVENOUS

## 2014-01-18 MED ORDER — DEXAMETHASONE SODIUM PHOSPHATE 4 MG/ML IJ SOLN
INTRAMUSCULAR | Status: DC | PRN
  Administered 2014-01-18: 10 mg via INTRAVENOUS

## 2014-01-18 MED ORDER — MIDAZOLAM HCL 2 MG/2ML IJ SOLN
1.0000 mg | INTRAMUSCULAR | Status: DC | PRN
  Administered 2014-01-18: 2 mg via INTRAVENOUS

## 2014-01-18 SURGICAL SUPPLY — 89 items
ADH SKN CLS APL DERMABOND .7 (GAUZE/BANDAGES/DRESSINGS) ×4
APL SKNCLS STERI-STRIP NONHPOA (GAUZE/BANDAGES/DRESSINGS)
APPLIER CLIP 9.375 MED OPEN (MISCELLANEOUS) ×4
APR CLP MED 9.3 20 MLT OPN (MISCELLANEOUS) ×2
BAG DECANTER FOR FLEXI CONT (MISCELLANEOUS) ×4 IMPLANT
BENZOIN TINCTURE PRP APPL 2/3 (GAUZE/BANDAGES/DRESSINGS) IMPLANT
BINDER BREAST LRG (GAUZE/BANDAGES/DRESSINGS) IMPLANT
BINDER BREAST MEDIUM (GAUZE/BANDAGES/DRESSINGS) IMPLANT
BINDER BREAST XLRG (GAUZE/BANDAGES/DRESSINGS) ×2 IMPLANT
BINDER BREAST XXLRG (GAUZE/BANDAGES/DRESSINGS) IMPLANT
BLADE SURG 11 STRL SS (BLADE) ×4 IMPLANT
BLADE SURG 15 STRL LF DISP TIS (BLADE) ×2 IMPLANT
BLADE SURG 15 STRL SS (BLADE) ×8
BLADE SURG ROTATE 9660 (MISCELLANEOUS) IMPLANT
CANISTER SUCT 1200ML W/VALVE (MISCELLANEOUS) ×4 IMPLANT
CHLORAPREP W/TINT 26ML (MISCELLANEOUS) ×4 IMPLANT
CLEANER CAUTERY TIP 5X5 PAD (MISCELLANEOUS) ×2 IMPLANT
CLIP APPLIE 9.375 MED OPEN (MISCELLANEOUS) IMPLANT
CLOSURE WOUND 1/2 X4 (GAUZE/BANDAGES/DRESSINGS)
COVER MAYO STAND STRL (DRAPES) ×4 IMPLANT
COVER PROBE W GEL 5X96 (DRAPES) ×4 IMPLANT
COVER TABLE BACK 60X90 (DRAPES) ×4 IMPLANT
DECANTER SPIKE VIAL GLASS SM (MISCELLANEOUS) IMPLANT
DERMABOND ADVANCED (GAUZE/BANDAGES/DRESSINGS) ×4
DERMABOND ADVANCED .7 DNX12 (GAUZE/BANDAGES/DRESSINGS) ×4 IMPLANT
DEVICE DUBIN W/COMP PLATE 8390 (MISCELLANEOUS) ×2 IMPLANT
DRAIN CHANNEL 19F RND (DRAIN) IMPLANT
DRAIN HEMOVAC 1/8 X 5 (WOUND CARE) IMPLANT
DRAPE C-ARM 42X72 X-RAY (DRAPES) ×4 IMPLANT
DRAPE LAPAROSCOPIC ABDOMINAL (DRAPES) ×4 IMPLANT
DRAPE UTILITY XL STRL (DRAPES) ×7 IMPLANT
DRSG TEGADERM 2-3/8X2-3/4 SM (GAUZE/BANDAGES/DRESSINGS) IMPLANT
ELECT COATED BLADE 2.86 ST (ELECTRODE) ×4 IMPLANT
ELECT REM PT RETURN 9FT ADLT (ELECTROSURGICAL) ×4
ELECTRODE REM PT RTRN 9FT ADLT (ELECTROSURGICAL) ×2 IMPLANT
EVACUATOR SILICONE 100CC (DRAIN) IMPLANT
GLOVE BIO SURGEON STRL SZ 6.5 (GLOVE) ×1 IMPLANT
GLOVE BIO SURGEONS STRL SZ 6.5 (GLOVE) ×1
GLOVE BIOGEL PI IND STRL 7.0 (GLOVE) ×2 IMPLANT
GLOVE BIOGEL PI IND STRL 8 (GLOVE) ×4 IMPLANT
GLOVE BIOGEL PI INDICATOR 7.0 (GLOVE) ×2
GLOVE BIOGEL PI INDICATOR 8 (GLOVE) ×4
GLOVE ECLIPSE 8.0 STRL XLNG CF (GLOVE) ×4 IMPLANT
GOWN STRL REUS W/ TWL LRG LVL3 (GOWN DISPOSABLE) ×4 IMPLANT
GOWN STRL REUS W/TWL LRG LVL3 (GOWN DISPOSABLE) ×12
HEMOSTAT SNOW SURGICEL 2X4 (HEMOSTASIS) ×3 IMPLANT
HEMOSTAT SURGICEL 2X14 (HEMOSTASIS) IMPLANT
IV KIT MINILOC 20X1 SAFETY (NEEDLE) IMPLANT
KIT MARKER MARGIN INK (KITS) ×4 IMPLANT
KIT PORT POWER 8FR ISP CVUE (Catheter) ×2 IMPLANT
NDL HYPO 25X1 1.5 SAFETY (NEEDLE) ×4 IMPLANT
NDL SAFETY ECLIPSE 18X1.5 (NEEDLE) ×2 IMPLANT
NDL SPNL 22GX3.5 QUINCKE BK (NEEDLE) IMPLANT
NEEDLE HYPO 18GX1.5 SHARP (NEEDLE) ×4
NEEDLE HYPO 22GX1.5 SAFETY (NEEDLE) IMPLANT
NEEDLE HYPO 25X1 1.5 SAFETY (NEEDLE) ×8 IMPLANT
NEEDLE SPNL 22GX3.5 QUINCKE BK (NEEDLE) IMPLANT
NS IRRIG 1000ML POUR BTL (IV SOLUTION) ×4 IMPLANT
PACK BASIN DAY SURGERY FS (CUSTOM PROCEDURE TRAY) ×4 IMPLANT
PAD CLEANER CAUTERY TIP 5X5 (MISCELLANEOUS)
PENCIL BUTTON HOLSTER BLD 10FT (ELECTRODE) ×4 IMPLANT
PIN SAFETY STERILE (MISCELLANEOUS) IMPLANT
SET SHEATH INTRODUCER 10FR (MISCELLANEOUS) IMPLANT
SHEATH COOK PEEL AWAY SET 9F (SHEATH) ×3 IMPLANT
SLEEVE SCD COMPRESS KNEE MED (MISCELLANEOUS) ×4 IMPLANT
SPONGE GAUZE 4X4 12PLY STER LF (GAUZE/BANDAGES/DRESSINGS) IMPLANT
SPONGE LAP 18X18 X RAY DECT (DISPOSABLE) IMPLANT
SPONGE LAP 4X18 X RAY DECT (DISPOSABLE) ×8 IMPLANT
STAPLER VISISTAT 35W (STAPLE) IMPLANT
STRIP CLOSURE SKIN 1/2X4 (GAUZE/BANDAGES/DRESSINGS) IMPLANT
SUT ETHILON 3 0 PS 1 (SUTURE) IMPLANT
SUT MNCRL AB 3-0 PS2 18 (SUTURE) ×8 IMPLANT
SUT MON AB 4-0 PC3 18 (SUTURE) ×4 IMPLANT
SUT PROLENE 2 0 CT2 30 (SUTURE) IMPLANT
SUT PROLENE 2 0 SH DA (SUTURE) ×6 IMPLANT
SUT SILK 2 0 SH (SUTURE) IMPLANT
SUT SILK 2 0 TIES 17X18 (SUTURE)
SUT SILK 2-0 18XBRD TIE BLK (SUTURE) IMPLANT
SUT VIC AB 3-0 SH 27 (SUTURE) ×4
SUT VIC AB 3-0 SH 27X BRD (SUTURE) ×2 IMPLANT
SUT VICRYL 3-0 CR8 SH (SUTURE) ×4 IMPLANT
SYR 5ML LUER SLIP (SYRINGE) ×4 IMPLANT
SYR BULB 3OZ (MISCELLANEOUS) ×4 IMPLANT
SYR CONTROL 10ML LL (SYRINGE) ×8 IMPLANT
TOWEL OR 17X24 6PK STRL BLUE (TOWEL DISPOSABLE) ×8 IMPLANT
TOWEL OR NON WOVEN STRL DISP B (DISPOSABLE) ×2 IMPLANT
TUBE CONNECTING 20'X1/4 (TUBING) ×1
TUBE CONNECTING 20X1/4 (TUBING) ×3 IMPLANT
YANKAUER SUCT BULB TIP NO VENT (SUCTIONS) ×4 IMPLANT

## 2014-01-18 NOTE — Anesthesia Procedure Notes (Signed)
Procedure Name: LMA Insertion Date/Time: 01/18/2014 10:11 AM Performed by: Baxter Flattery Pre-anesthesia Checklist: Patient identified, Emergency Drugs available, Suction available and Patient being monitored Patient Re-evaluated:Patient Re-evaluated prior to inductionOxygen Delivery Method: Circle System Utilized Preoxygenation: Pre-oxygenation with 100% oxygen Intubation Type: IV induction Ventilation: Mask ventilation without difficulty LMA: LMA inserted LMA Size: 4.0 Number of attempts: 1 Airway Equipment and Method: bite block Placement Confirmation: positive ETCO2 and breath sounds checked- equal and bilateral Tube secured with: Tape Dental Injury: Teeth and Oropharynx as per pre-operative assessment

## 2014-01-18 NOTE — Interval H&P Note (Signed)
History and Physical Interval Note:  01/18/2014 8:49 AM  Regina Martinez  has presented today for surgery, with the diagnosis of LEFT BREAST CANCER   The various methods of treatment have been discussed with the patient and family. After consideration of risks, benefits and other options for treatment, the patient has consented to  Procedure(s): BREAST LUMPECTOMY WITH NEEDLE LOCALIZATION AND AXILLARY SENTINEL LYMPH NODE BX (Left) INSERTION PORT-A-CATH (N/A) as a surgical intervention .  The patient's history has been reviewed, patient examined, no change in status, stable for surgery.  I have reviewed the patient's chart and labs.  Questions were answered to the patient's satisfaction.     Kritika Stukes A.

## 2014-01-18 NOTE — Brief Op Note (Signed)
01/18/2014  11:52 AM  PATIENT:  Regina Martinez  53 y.o. female  PRE-OPERATIVE DIAGNOSIS:  LEFT BREAST CANCER   POST-OPERATIVE DIAGNOSIS:  LEFT BREAST CANCER   PROCEDURE:  Procedure(s): BREAST LUMPECTOMY WITH NEEDLE LOCALIZATION AND AXILLARY SENTINEL LYMPH NODE BIOPSY (Left) INSERTION PORT-A-CATH (Right)  SURGEON:  Surgeon(s) and Role:    * Cheyenne Bordeaux A. Odes Lolli, MD - Primary       ANESTHESIA:   local and general  EBL:  Total I/O In: 1300 [I.V.:1300] Out: -   BLOOD ADMINISTERED:none  DRAINS: none   LOCAL MEDICATIONS USED:  BUPIVICAINE   SPECIMEN:  Source of Specimen:  left breast and axilla  DISPOSITION OF SPECIMEN:  PATHOLOGY  COUNTS:  YES  TOURNIQUET:  * No tourniquets in log *  DICTATION: .Other Dictation: Dictation Number  [id number]  PLAN OF CARE: Discharge to home after PACU  PATIENT DISPOSITION:  PACU - hemodynamically stable.   Delay start of Pharmacological VTE agent (>24hrs) due to surgical blood loss or risk of bleeding: not applicable

## 2014-01-18 NOTE — Op Note (Signed)
NAMERENELLA, STEIG NO.:  [id number]  MEDICAL RECORD NO.:  [id number]  LOCATION:                                 FACILITY:  PHYSICIAN:  Marcello Moores A. Seth Higginbotham, M.D.DATE OF BIRTH:  January 31, 1961  DATE OF PROCEDURE: DATE OF DISCHARGE:                              OPERATIVE REPORT   PREOPERATIVE DIAGNOSIS:  Stage I left breast cancer.  POSTOPERATIVE DIAGNOSIS:  Stage I left breast cancer.  PROCEDURE: 1. Left breast needle localized partial mastectomy x2. 2. Left axillary sentinel lymph node mapping with methylene blue dye. 3. Placement of right subclavian 8-French clear view Port-A-Cath under     fluoroscopic guidance.  SURGEON:  Marcello Moores A. Tuyen Uncapher, M.D.  ANESTHESIA:  LMA with 0.25% Sensorcaine local.  ESTIMATED BLOOD LOSS:  Approximately 50 mL.  SPECIMEN: 1. Left breast lumpectomy with 2 wires and 2 clips, verified by     radiography to be adequate to pathology. 2. Two left axillary sentinel nodes to pathology.  DRAINS:  None.  IV FLUIDS:  Approximately 600 mL of crystalloid.  INDICATIONS FOR PROCEDURE:  The patient is a pleasant 53 year old female, recently diagnosed with stage I left breast cancer.  She will require postoperative chemotherapy and a Port-A-Cath was requested by the oncologist.  She was seen in the [facility]. We discussed lumpectomy with radiation therapy and axillary sentinel lymph node mapping as well as Port-A-Cath placement since she wished to conserve her breast.  We talked about mastectomy reconstruction.  All these options were discussed and she opted for breast conservation. Risk of procedure were discussed to include, but not exclusive of bleeding, infection, pneumothorax, hemothorax, perforation and mediastinal structure, other operations to repair this, nerve injury, blood vessel injury, migration of the catheter, failure of catheter, the need to replace the catheter, hematoma lumpectomy site,  infection lumpectomy site, more surgery, left shoulder stiffness, left shoulder numbness, formation of seroma, lymphedema, and the need for other procedures and operations for her cancer.  She voiced understanding and wished to proceed.  DESCRIPTION OF PROCEDURE:  The patient underwent bracketed left breast wire localization at the [facility] in [place].  She came via the outpatient surgical center.  Left breast was marked.  Questions were answered.  She underwent injection by Nuclear Medicine of her left breast for sentinel lymph node mapping without difficulty.  She was then taken back to the operating room and placed supine.  Right arm was tucked.  Left arm was placed out on an arm board and the upper chest was prepped and draped in sterile fashion after time-out.  I injected 4 mL of methylene blue dye prior to the time out in the left breast for mapping under sterile conditions.  We then prepped and draped the upper chest and neck regions bilaterally.  Second time-out was done and she received 2 g of Ancef.  The Port-A-Cath was placed first.  The right subclavian region was targeted.  She was placed in Trendelenburg.  I used a needle to be advance into the right subclavian vein without difficulty with the return of dark nonpulsatile blood.  A wire was fed through this and under fluoroscopic guidance was fed down  the superior vena cava.  Small stab incision was made at the wire insertion site. Small pocket was created with cautery and scalpel.  A 8-French ClearView Port was then brought on the field, it was attached and flushed.  It was tunneled from the lower incision to the upper incision were the  wire exited.  With the patient in Trendelenburg, we were able to pass a dilator introducer complex over the wire moving to and fro without difficulty.  We then removed the wire and dilator and placed the catheter in the peel-away sheath and peeled this away without difficulty.  The  tip of the catheter appeared to be in the distal superior vena cava at the junction of the right atrium.  There was no ectopy.  I then flushed the catheter, it flushed easily and drew back dark blood without difficulty.  We then secured the catheter at the chest wall with a single stitch of 2-0 Prolene.  These wounds were closed in a combination of 3-0 Vicryl and 4-0 Monocryl for subcuticular stitch.  The catheter appeared to be in good position overall.  The lumpectomy on the left side was done next.  The  2 wires exited the breast in the upper outer quadrant.  Curvilinear incision was made.    the Neoprobe  Was used to mark the sentinel node hot spot.  We dissected down and both wires were brought out through the incision.  All tissue around both wires was excised.  The specimen radiograph showed both clips to be in the center with both wires to be intact.  The cavity was  hemostatic and irrigated.  We then used a Neoprobe in the same incision to identify the sentinel node.  There were 2 hot blue sentinel nodes which were level 1,  they were removed without difficulty.  Hemostasis was  achieved.  Irrigation used until clear.  Surgicel SNoW placed at the lymph node harvest site.  The remainder the cavity was irrigated and marked with clips.  This was found to be hemostatic.  The cavity was then closed with a combination of deep 3-0 Vicryl 4-0 Monocryl for a skin stitch in a subcuticular fashion.  Dermabond applied to both sides. The patient was then extubated and taken to recovery in satisfactory condition.  All final counts found to be correct.  Chest x-ray pending in PACU.     Bentli Llorente A. Madina Galati, M.D.     TAC/MEDQ  D:  01/18/2014  T:  01/18/2014  Job:  [id number]

## 2014-01-18 NOTE — Anesthesia Postprocedure Evaluation (Signed)
  Anesthesia Post-op Note  Patient: Regina Martinez  Procedure(s) Performed: Procedure(s): BREAST LUMPECTOMY WITH NEEDLE LOCALIZATION AND AXILLARY SENTINEL LYMPH NODE BIOPSY (Left) INSERTION PORT-A-CATH (Right)  Patient Location: PACU  Anesthesia Type:General  Level of Consciousness: awake and alert   Airway and Oxygen Therapy: Patient Spontanous Breathing  Post-op Pain: mild  Post-op Assessment: Post-op Vital signs reviewed and No signs of Nausea or vomiting  Post-op Vital Signs: stable  Complications: No apparent anesthesia complications

## 2014-01-18 NOTE — Anesthesia Preprocedure Evaluation (Signed)
Anesthesia Evaluation  Patient identified by MRN, date of birth, ID band Patient awake    Reviewed: Allergy & Precautions, H&P , NPO status , Patient's Chart, lab work & pertinent test results  History of Anesthesia Complications Negative for: history of anesthetic complications  Airway Mallampati: I      Dental   Pulmonary  breath sounds clear to auscultation        Cardiovascular negative cardio ROS  Rhythm:Regular Rate:Normal     Neuro/Psych    GI/Hepatic negative GI ROS, Neg liver ROS,   Endo/Other    Renal/GU negative Renal ROS     Musculoskeletal   Abdominal   Peds  Hematology   Anesthesia Other Findings   Reproductive/Obstetrics                           Anesthesia Physical Anesthesia Plan  ASA: I  Anesthesia Plan: General   Post-op Pain Management:    Induction:   Airway Management Planned: LMA  Additional Equipment:   Intra-op Plan:   Post-operative Plan: Extubation in OR  Informed Consent: I have reviewed the patients History and Physical, chart, labs and discussed the procedure including the risks, benefits and alternatives for the proposed anesthesia with the patient or authorized representative who has indicated his/her understanding and acceptance.     Plan Discussed with: CRNA and Surgeon  Anesthesia Plan Comments:         Anesthesia Quick Evaluation

## 2014-01-18 NOTE — Transfer of Care (Signed)
Immediate Anesthesia Transfer of Care Note  Patient: Regina Martinez  Procedure(s) Performed: Procedure(s): BREAST LUMPECTOMY WITH NEEDLE LOCALIZATION AND AXILLARY SENTINEL LYMPH NODE BIOPSY (Left) INSERTION PORT-A-CATH (Right)  Patient Location: PACU  Anesthesia Type:General  Level of Consciousness: awake, alert  and patient cooperative  Airway & Oxygen Therapy: Patient Spontanous Breathing and Patient connected to face mask oxygen  Post-op Assessment: Report given to PACU RN, Post -op Vital signs reviewed and stable and Patient moving all extremities  Post vital signs: Reviewed and stable  Complications: No apparent anesthesia complications

## 2014-01-18 NOTE — H&P (View-Only) (Signed)
Patient ID: Regina Martinez, female   DOB: 01/10/1961, 53 y.o.   MRN: [id number]  No chief complaint on file.   HPI Regina Martinez is a 53 y.o. female.   HPI pt sent at the request of Regina Anderson, FNP for left breast cancer. Pt has had no symptoms.  No pain, mass or other symptom. Pt does have family history.     Past Medical History  Diagnosis Date  . Anxiety     Past Surgical History  Procedure Laterality Date  . Cesarean section      Family History  Problem Relation Age of Onset  . Breast cancer Mother   . Breast cancer Sister     Social History History  Substance Use Topics  . Smoking status: Never Smoker   . Smokeless tobacco: Not on file  . Alcohol Use: No    No Known Allergies  Current Outpatient Prescriptions  Medication Sig Dispense Refill  . Calcium Carb-Cholecalciferol (CALCIUM 600 + D PO) Take 1 tablet by mouth daily.      . Cholecalciferol (VITAMIN D-3 PO) Take 2,000 Int'l Units by mouth daily.      . HYDROcodone-homatropine (HYCODAN) 5-1.5 MG/5ML syrup Take 5 mLs by mouth every 6 (six) hours as needed for cough.      . ibuprofen (ADVIL,MOTRIN) 200 MG tablet Take 400 mg by mouth every 6 (six) hours as needed. For pain      . ondansetron (ZOFRAN) 4 MG tablet Take 1 tablet (4 mg total) by mouth every 6 (six) hours.  12 tablet  0  . oxyCODONE-acetaminophen (PERCOCET/ROXICET) 5-325 MG per tablet Take 1-2 tablets by mouth every 6 (six) hours as needed for pain.  20 tablet  0   No current facility-administered medications for this visit.    Review of Systems Review of Systems  Constitutional: Negative for fever, chills and unexpected weight change.  HENT: Negative for congestion, hearing loss, sore throat, trouble swallowing and voice change.   Eyes: Negative for visual disturbance.  Respiratory: Negative for cough and wheezing.   Cardiovascular: Negative for chest pain, palpitations and leg swelling.  Gastrointestinal: Negative for nausea, vomiting,  abdominal pain, diarrhea, constipation, blood in stool, abdominal distention and anal bleeding.  Genitourinary: Negative for hematuria, vaginal bleeding and difficulty urinating.  Musculoskeletal: Negative for arthralgias.  Skin: Negative for rash and wound.  Neurological: Negative for seizures, syncope and headaches.  Hematological: Negative for adenopathy. Does not bruise/bleed easily.  Psychiatric/Behavioral: Negative for confusion.    There were no vitals taken for this visit.  Physical Exam Physical Exam  Constitutional: She is oriented to person, place, and time. She appears well-developed and well-nourished.  HENT:  Head: Normocephalic and atraumatic.  Eyes: Pupils are equal, round, and reactive to light. No scleral icterus.  Neck: Normal range of motion. Neck supple.  Cardiovascular: Normal rate and regular rhythm.   Pulmonary/Chest: Right breast exhibits no inverted nipple, no mass, no nipple discharge, no skin change and no tenderness. Left breast exhibits no inverted nipple, no mass, no nipple discharge, no skin change and no tenderness. Breasts are symmetrical.    Musculoskeletal: Normal range of motion.  Lymphadenopathy:    She has no cervical adenopathy.    She has no axillary adenopathy.  Neurological: She is alert and oriented to person, place, and time.  Skin: Skin is warm and dry.  Psychiatric: She has a normal mood and affect. Her behavior is normal. Judgment and thought content normal.    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 Benign left  axillary lymph node biopsy.  EXAM:  BILATERAL BREAST MRI WITH AND WITHOUT CONTRAST  LABS: None  TECHNIQUE:  Multiplanar, multisequence MR images of both breasts were obtained  prior to and following the intravenous administration of 93ml of  MultiHance.  THREE-DIMENSIONAL  MR IMAGE RENDERING ON INDEPENDENT WORKSTATION:  Three-dimensional MR images were rendered by post-processing of the  original MR data on an independent workstation. The  three-dimensional MR images were interpreted, and findings are  reported in the following complete MRI report for this study. Three  dimensional images were evaluated at the independent DynaCad  workstation  COMPARISON: Previous exams, CT abdomen pelvis 12/17/2012  FINDINGS:  Breast composition: b. Scattered fibroglandular tissue.  Background parenchymal enhancement: Minimal  Right breast: No mass or abnormal enhancement.  Left breast: In the left breast far posterior 3 o'clock location,  there is a 2 cm area of clumped nodular enhancement with minimal  plateau type enhancement kinetics, and associated clip artifact,  corresponding to the areas of biopsy proven DCIS/invasive ductal  carcinoma. No other dominant area of abnormal enhancement is seen  elsewhere in either breast.  Lymph nodes: No lymphadenopathy is identified, with minimal cortical  prominence of normal-sized left axillary lymph nodes reidentified.  One of these with minimal eccentric cortical thickening likely  corresponds to a recently biopsied benign left axillary lymph node.  Ancillary findings: At incomplete imaging of the upper abdomen,  there is a 5 mm nonenhancing T2 hyperintense mass in the lateral  segment left hepatic lobe most likely a cyst. This is not visible on  the prior dissimilar exam, possibly due to its small size.  IMPRESSION:  2 cm area of minimal clumped nodular enhancement with plateau type  enhancement kinetics at the site of biopsy proven invasive ductal  carcinoma/DCIS. Allowing for the subtlety of this finding, there is  no MRI evidence for multifocal/ multicentric or contralateral  malignancy.  RECOMMENDATION:  Treatment plan  BI-RADS CATEGORY 6: Known biopsy-proven malignancy - appropriate  action should be taken.    Electronically Signed  By: Conchita Paris M.D.   Path  Two foci  Left breast uoq total area 1.4 cm  ER/PR POS  HER 2 NEU POS  Assessment    STAGE 1 LEFT BREAST CANCER    Plan    LEFT LUMPECTOMY LEFT SLN  MAPPING AND PORT PLACEMENT.  Risk of port placement include  Bleeding infection,  ptx,  organ injury, death,  Malfunction,  Migration,  And other procedures. The procedure has been discussed with the patient. Alternatives to surgery have been discussed with the patient.  Risks of surgery include bleeding,  Infection,  Seroma formation, death,  and the need for further surgery.   The patient understands and wishes to proceed.Sentinel lymph node mapping and dissection has been discussed with the patient.  Risk of bleeding,  Infection,  Seroma formation,  Additional procedures,,  Shoulder weakness ,  Shoulder stiffness,  Nerve and blood vessel injury and reaction to the mapping dyes have been discussed.  Alternatives to surgery have been discussed with the patient.  The patient agrees to proceed.       Sufyan Meidinger A. 12/22/2013, 10:54 AM

## 2014-01-18 NOTE — Discharge Instructions (Signed)
[facility] °Office Phone Number [phone] ° °BREAST BIOPSY/ PARTIAL MASTECTOMY: POST OP INSTRUCTIONS ° °Always review your discharge instruction sheet given to you by the facility where your surgery was performed. ° °IF YOU HAVE DISABILITY OR FAMILY LEAVE FORMS, YOU MUST BRING THEM TO THE OFFICE FOR PROCESSING.  DO NOT GIVE THEM TO YOUR DOCTOR. ° °1. A prescription for pain medication may be given to you upon discharge.  Take your pain medication as prescribed, if needed.  If narcotic pain medicine is not needed, then you may take acetaminophen (Tylenol) or ibuprofen (Advil) as needed. °2. Take your usually prescribed medications unless otherwise directed °3. If you need a refill on your pain medication, please contact your pharmacy.  They will contact our office to request authorization.  Prescriptions will not be filled after 5pm or on week-ends. °4. You should eat very light the first 24 hours after surgery, such as soup, crackers, pudding, etc.  Resume your normal diet the day after surgery. °5. Most patients will experience some swelling and bruising in the breast.  Ice packs and a good support bra will help.  Swelling and bruising can take several days to resolve.  °6. It is common to experience some constipation if taking pain medication after surgery.  Increasing fluid intake and taking a stool softener will usually help or prevent this problem from occurring.  A mild laxative (Milk of Magnesia or Miralax) should be taken according to package directions if there are no bowel movements after 48 hours. °7. Unless discharge instructions indicate otherwise, you may remove your bandages 24-48 hours after surgery, and you may shower at that time.  You may have steri-strips (small skin tapes) in place directly over the incision.  These strips should be left on the skin for 7-10 days.  If your surgeon used skin glue on the incision, you may shower in 24 hours.  The glue will flake off over the  next 2-3 weeks.  Any sutures or staples will be removed at the office during your follow-up visit. °8. ACTIVITIES:  You may resume regular daily activities (gradually increasing) beginning the next day.  Wearing a good support bra or sports bra minimizes pain and swelling.  You may have sexual intercourse when it is comfortable. °a. You may drive when you no longer are taking prescription pain medication, you can comfortably wear a seatbelt, and you can safely maneuver your car and apply brakes. °b. RETURN TO WORK:  ______________________________________________________________________________________ °9. You should see your doctor in the office for a follow-up appointment approximately two weeks after your surgery.  Your doctor’s nurse will typically make your follow-up appointment when she calls you with your pathology report.  Expect your pathology report 2-3 business days after your surgery.  You may call to check if you do not hear from us after three days. °10. OTHER INSTRUCTIONS: _______________________________________________________________________________________________ _____________________________________________________________________________________________________________________________________ °_____________________________________________________________________________________________________________________________________ °_____________________________________________________________________________________________________________________________________ ° °WHEN TO CALL YOUR DOCTOR: °1. Fever over 101.0 °2. Nausea and/or vomiting. °3. Extreme swelling or bruising. °4. Continued bleeding from incision. °5. Increased pain, redness, or drainage from the incision. ° °The clinic staff is available to answer your questions during regular business hours.  Please don’t hesitate to call and ask to speak to one of the nurses for clinical concerns.  If you have a medical emergency, go to the nearest  emergency room or call 911.  A surgeon from [facility] is always on call at the hospital. ° °For further questions, please visit [email or web address]  ° ° °  Post Anesthesia Home Care Instructions ° °Activity: °Get plenty of rest for the remainder of the day. A responsible adult should stay with you for 24 hours following the procedure.  °For the next 24 hours, DO NOT: °-Drive a car °-Operate machinery °-Drink alcoholic beverages °-Take any medication unless instructed by your physician °-Make any legal decisions or sign important papers. ° °Meals: °Start with liquid foods such as gelatin or soup. Progress to regular foods as tolerated. Avoid greasy, spicy, heavy foods. If nausea and/or vomiting occur, drink only clear liquids until the nausea and/or vomiting subsides. Call your physician if vomiting continues. ° °Special Instructions/Symptoms: °Your throat may feel dry or sore from the anesthesia or the breathing tube placed in your throat during surgery. If this causes discomfort, gargle with warm salt water. The discomfort should disappear within 24 hours. ° °

## 2014-01-18 NOTE — Progress Notes (Signed)
Assisted with pre nuc med inj  Side rails up, monitors on throughout procedure. See vital signs in flow sheet. Tolerated Procedure well.

## 2014-01-19 ENCOUNTER — Ambulatory Visit (HOSPITAL_COMMUNITY): Payer: No Typology Code available for payment source

## 2014-01-19 ENCOUNTER — Other Ambulatory Visit (HOSPITAL_COMMUNITY): Payer: No Typology Code available for payment source

## 2014-01-19 ENCOUNTER — Encounter (HOSPITAL_BASED_OUTPATIENT_CLINIC_OR_DEPARTMENT_OTHER): Payer: Self-pay | Admitting: Surgery

## 2014-01-24 ENCOUNTER — Other Ambulatory Visit: Payer: No Typology Code available for payment source

## 2014-01-24 ENCOUNTER — Other Ambulatory Visit: Payer: Self-pay | Admitting: *Deleted

## 2014-01-24 ENCOUNTER — Other Ambulatory Visit: Payer: Self-pay | Admitting: Oncology

## 2014-01-24 DIAGNOSIS — C50412 Malignant neoplasm of upper-outer quadrant of left female breast: Secondary | ICD-10-CM

## 2014-01-24 MED ORDER — PROCHLORPERAZINE MALEATE 10 MG PO TABS: 10.0000 mg | ORAL_TABLET | Freq: Four times a day (QID) | ORAL | Status: DC | PRN

## 2014-01-24 MED ORDER — ONDANSETRON HCL 8 MG PO TABS: 8.0000 mg | ORAL_TABLET | Freq: Two times a day (BID) | ORAL | Status: DC

## 2014-01-24 MED ORDER — LORAZEPAM 0.5 MG PO TABS: 0.5000 mg | ORAL_TABLET | Freq: Four times a day (QID) | ORAL | Status: DC | PRN

## 2014-01-24 MED ORDER — LIDOCAINE-PRILOCAINE 2.5-2.5 % EX CREA: TOPICAL_CREAM | CUTANEOUS | Status: DC | PRN

## 2014-01-24 MED ORDER — DEXAMETHASONE 4 MG PO TABS: 8.0000 mg | ORAL_TABLET | Freq: Two times a day (BID) | ORAL | Status: DC

## 2014-01-25 ENCOUNTER — Telehealth: Payer: Self-pay | Admitting: *Deleted

## 2014-01-25 ENCOUNTER — Encounter: Payer: Self-pay | Admitting: Oncology

## 2014-01-25 ENCOUNTER — Telehealth (INDEPENDENT_AMBULATORY_CARE_PROVIDER_SITE_OTHER): Payer: Self-pay

## 2014-01-25 ENCOUNTER — Other Ambulatory Visit: Payer: Self-pay | Admitting: *Deleted

## 2014-01-25 NOTE — Telephone Encounter (Signed)
This RN called in Zofran, Decadron and Ativan to [facility] on [address]. Faxed copies never received. Hard copies destroyed. Patient aware that prescriptions have been called in. Patient verbalized understanding.

## 2014-01-25 NOTE — Progress Notes (Signed)
Per [place] her insurance termed 01/22/14. I called the patient to get new insurance info and had to leave a message. Noted in system on next visit we need new insurance. No treatment can be pre [place] without new insurance.

## 2014-01-25 NOTE — Telephone Encounter (Signed)
LMOM> path shows clear margins and negative nodes.

## 2014-02-01 ENCOUNTER — Other Ambulatory Visit (HOSPITAL_BASED_OUTPATIENT_CLINIC_OR_DEPARTMENT_OTHER): Payer: No Typology Code available for payment source

## 2014-02-01 ENCOUNTER — Ambulatory Visit: Payer: No Typology Code available for payment source

## 2014-02-01 ENCOUNTER — Encounter: Payer: Self-pay | Admitting: Oncology

## 2014-02-01 ENCOUNTER — Ambulatory Visit (HOSPITAL_BASED_OUTPATIENT_CLINIC_OR_DEPARTMENT_OTHER): Payer: No Typology Code available for payment source | Admitting: Oncology

## 2014-02-01 VITALS — BP 113/72 | HR 65 | Temp 98.0°F | Resp 18 | Ht 63.0 in | Wt 154.7 lb

## 2014-02-01 DIAGNOSIS — C50412 Malignant neoplasm of upper-outer quadrant of left female breast: Secondary | ICD-10-CM

## 2014-02-01 DIAGNOSIS — C50419 Malignant neoplasm of upper-outer quadrant of unspecified female breast: Secondary | ICD-10-CM

## 2014-02-01 DIAGNOSIS — Z170 Estrogen receptor positive status [ER+]: Secondary | ICD-10-CM

## 2014-02-01 LAB — CBC WITH DIFFERENTIAL/PLATELET
BASO%: 0.1 % (ref 0.0–2.0)
BASOS ABS: 0 10*3/uL (ref 0.0–0.1)
EOS%: 1.3 % (ref 0.0–7.0)
Eosinophils Absolute: 0.1 10*3/uL (ref 0.0–0.5)
HCT: 39.5 % (ref 34.8–46.6)
HEMOGLOBIN: 13.6 g/dL (ref 11.6–15.9)
LYMPH#: 1.2 10*3/uL (ref 0.9–3.3)
LYMPH%: 10.7 % — ABNORMAL LOW (ref 14.0–49.7)
MCH: 31 pg (ref 25.1–34.0)
MCHC: 34.4 g/dL (ref 31.5–36.0)
MCV: 90 fL (ref 79.5–101.0)
MONO#: 0.3 10*3/uL (ref 0.1–0.9)
MONO%: 2.3 % (ref 0.0–14.0)
NEUT#: 9.3 10*3/uL — ABNORMAL HIGH (ref 1.5–6.5)
NEUT%: 85.6 % — ABNORMAL HIGH (ref 38.4–76.8)
Platelets: 202 10*3/uL (ref 145–400)
RBC: 4.39 10*6/uL (ref 3.70–5.45)
RDW: 13.1 % (ref 11.2–14.5)
WBC: 10.8 10*3/uL — ABNORMAL HIGH (ref 3.9–10.3)
nRBC: 0 % (ref 0–0)

## 2014-02-01 NOTE — Progress Notes (Signed)
OFFICE PROGRESS NOTE  CC  [name], MD [place] [address] Dr. Marcello Moores Cornett Dr. Thea Silversmith  DIAGNOSIS: 53 year old female with stage I left breast cancer that was ER positive PR positive HER-2/neu positive. Patient was originally seen at the multidisciplinary breast clinic on 12/22/2013.  PRIOR THERAPY:  #1Underwent a screening mammogram that revealed a left breast mass with calcifications in October 2014. This mass was in the posterior aspect of the upper outer quadrant at the 3:00 position. Patient went on to have an ultrasound of the left breast that did reveal 2 distinct masses one measuring 8 mm and the second measuring 4 mm. There was also noted to be a lymph node which was biopsied and it was negative for metastatic cancer. 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 She had MRI of the breasts performed that showed 2 areas with a full measurement at 1.4 cm. She was also noted to have 2 cm area of clumped nodular enhancement. Corresponding to the areas of biopsy-proven DCIS/invasive ductal carcinoma. There was no lymphadenopathy identified  #2 patient is now status post left breast lumpectomy with sentinel lymph node biopsy performed on 01/18/2014. Patient also had a Port-A-Cath placed at the same time. Her final pathology revealed Breast, lumpectomy, Left - INVASIVE GRADE II DUCTAL CARCINOMA WITH CALCIFICATIONS SPANNING 0.4 CM IN GREATEST DIMENSION. - ASSOCIATED INTERMEDIATE GRADE DUCTAL CARCINOMA IN SITU WITH CALCIFICATIONS. - MARGINS ARE NEGATIVE. - SEE ONCOLOGY TEMPLATE. 2. Lymph node, sentinel, biopsy, Left axillary #1 - ONE BENIGN LYMPH NODE WITH NO TUMOR SEEN (0/1). 3. Lymph node, sentinel, biopsy, Left axillary #2 - ONE BENIGN LYMPH NODE WITH NO TUMOR SEEN (0/1).   CURRENT THERAPY: Proceed with  radiation therapy./, Patient will not need chemotherapy or anti-HER-2 therapy the 2 small size of the tumor  INTERVAL HISTORY: Regina Martinez 53 y.o. female returns for followup visit after the lumpectomy and sentinel lymph node biopsy. Her final pathology only revealed a 0.4 cm area of disease. Patient and I and her niece we discussed the final pathology in detail today. I have recommended no chemotherapy or anti-HER-2 therapy based on the size of the tumor and the fact that her lymph nodes were negative. I do not think she would benefit from chemotherapy. However patient will need adjuvant radiation therapy followed by antiestrogen therapy. We discussed the rationale for this. Postoperatively patient is doing well she is feeling very well from the surgery. Today she denies any headaches double vision blurring of vision fevers chills night sweats. No shortness of breath chest pains palpitations. No abdominal pain no diarrhea or constipation. She has no easy bruising or bleeding. She has no myalgias and arthralgias. No peripheral paresthesias or gait disturbances. Remainder of the 10 point review of systems is negative.   MEDICAL HISTORY: Past Medical History  Diagnosis Date  . Anxiety   . Breast cancer 2015    triple positive  . Wears glasses     ALLERGIES:  has No Known Allergies.  MEDICATIONS:  Current Outpatient Prescriptions  Medication Sig Dispense Refill  . Calcium Carb-Cholecalciferol (CALCIUM 600 + D PO) Take 1 tablet by mouth daily.      . Cholecalciferol (VITAMIN D-3 PO) Take 2,000 Int'l Units by mouth daily.      Marland Kitchen dexamethasone (DECADRON) 4 MG tablet Take 2 tablets (8 mg total) by mouth 2 (two)  times daily with a meal. Take two times a day the day before Taxotere. Then take two times a day starting the day after chemo for 3 days.  30 tablet  1  . lidocaine-prilocaine (EMLA) cream Apply topically as needed.  30 g  6  . LORazepam (ATIVAN) 0.5 MG tablet Take 1 tablet (0.5 mg total) by  mouth every 6 (six) hours as needed (Nausea or vomiting).  30 tablet  0  . ondansetron (ZOFRAN) 4 MG tablet Take 1 tablet (4 mg total) by mouth every 6 (six) hours.  12 tablet  0  . ondansetron (ZOFRAN) 8 MG tablet Take 1 tablet (8 mg total) by mouth 2 (two) times daily. Take two times a day starting the day after chemo for 3 days. Then take two times a day as needed for nausea or vomiting.  30 tablet  1  . oxyCODONE-acetaminophen (ROXICET) 5-325 MG per tablet Take 1 tablet by mouth every 4 (four) hours as needed.  30 tablet  0  . prochlorperazine (COMPAZINE) 10 MG tablet Take 1 tablet (10 mg total) by mouth every 6 (six) hours as needed (Nausea or vomiting).  30 tablet  1  . traMADol (ULTRAM) 50 MG tablet Take 1 tablet (50 mg total) by mouth every 6 (six) hours as needed.  30 tablet  0   No current facility-administered medications for this visit.    SURGICAL HISTORY:  Past Surgical History  Procedure Laterality Date  . Cesarean section    . Breast lumpectomy with needle localization and axillary sentinel lymph node bx Left 01/18/2014    Procedure: BREAST LUMPECTOMY WITH NEEDLE LOCALIZATION AND AXILLARY SENTINEL LYMPH NODE BIOPSY;  Surgeon: Joyice Faster. Cornett, MD;  Location: [facility];  Service: General;  Laterality: Left;  . Portacath placement Right 01/18/2014    Procedure: INSERTION PORT-A-CATH;  Surgeon: Joyice Faster. Cornett, MD;  Location: [facility];  Service: General;  Laterality: Right;    REVIEW OF SYSTEMS:  Pertinent items are noted in HPI.     PHYSICAL EXAMINATION: Blood pressure 113/72, pulse 65, temperature 98 F (36.7 C), temperature source Oral, resp. rate 18, height 5' 3"  (1.6 m), weight 154 lb 11.2 oz (70.171 kg). Body mass index is 27.41 kg/(m^2). ECOG PERFORMANCE STATUS: 0 - Asymptomatic   General appearance: alert, cooperative and appears stated age Lymph nodes: Cervical, supraclavicular, and axillary nodes normal. Resp: clear to  auscultation bilaterally Back: symmetric, no curvature. ROM normal. No CVA tenderness. Cardio: regular rate and rhythm GI: soft, non-tender; bowel sounds normal; no masses,  no organomegaly Extremities: extremities normal, atraumatic, no cyanosis or edema Neurologic: Grossly normal Breasts: right breast normal without mass, skin or nipple changes or axillary nodes, left breast normal without mass, skin or nipple changes or axillary nodes, surgical scars noted in the left breast healing well.Marland Kitchen   LABORATORY DATA: Lab Results  Component Value Date   WBC 10.8* 02/01/2014   HGB 13.6 02/01/2014   HCT 39.5 02/01/2014   MCV 90.0 02/01/2014   PLT 202 02/01/2014      Chemistry      Component Value Date/Time   NA 144 01/12/2014 1210   NA 142 12/22/2013 0824   K 4.1 01/12/2014 1210   K 4.0 12/22/2013 0824   CL 104 01/12/2014 1210   CO2 26 01/12/2014 1210   CO2 29 12/22/2013 0824   BUN 11 01/12/2014 1210   BUN 10.2 12/22/2013 0824   CREATININE 0.56 01/12/2014 1210   CREATININE  0.7 12/22/2013 0824      Component Value Date/Time   CALCIUM 9.4 01/12/2014 1210   CALCIUM 9.5 12/22/2013 0824   ALKPHOS 50 01/12/2014 1210   ALKPHOS 48 12/22/2013 0824   AST 18 01/12/2014 1210   AST 17 12/22/2013 0824   ALT 15 01/12/2014 1210   ALT 15 12/22/2013 0824   BILITOT 0.6 01/12/2014 1210   BILITOT 0.89 12/22/2013 0824     Diagnosis 1. Breast, lumpectomy, Left - INVASIVE GRADE II DUCTAL CARCINOMA WITH CALCIFICATIONS SPANNING 0.4 CM IN GREATEST DIMENSION. - ASSOCIATED INTERMEDIATE GRADE DUCTAL CARCINOMA IN SITU WITH CALCIFICATIONS. - MARGINS ARE NEGATIVE. - SEE ONCOLOGY TEMPLATE. 2. Lymph node, sentinel, biopsy, Left axillary #1 - ONE BENIGN LYMPH NODE WITH NO TUMOR SEEN (0/1). 3. Lymph node, sentinel, biopsy, Left axillary #2 - ONE BENIGN LYMPH NODE WITH NO TUMOR SEEN (0/1). Microscopic Comment 1. BREAST, INVASIVE TUMOR, WITH LYMPH NODE SAMPLING Specimen, including laterality and lymph node sampling (sentinel,  non-sentinel): Left partial breast with left sentinel lymph node sampling Procedure: Left breast lumpectomy with left sentinel lymph node biopsies. Histologic type: Invasive ductal carcinoma Grade: II. Tubule formation: 3. Nuclear pleomorphism: 2. Mitotic: 2. Tumor size (glass slide measurement): 0.4 cm. Margins: Invasive, distance to closest margin: 0.2 cm (anterior margin). In-situ, distance to closest margin: Greater than 0.5 cm. Lymphovascular invasion: Definitive lymphovascular invasion is not identified. Ductal carcinoma in situ: Yes. Grade: Intermediate grade. Extensive intraductal component: No. Lobular neoplasia: No. Tumor focality: Unifocal. Treatment effect: N/A. Extent of tumor: Tumor confined to breast parenchyma. 1 of 3 FINAL for Mace, Precious [phone]) Microscopic Comment(continued) Skin: Not received. Nipple: Not received. Skeletal muscle: Not received. Lymph nodes: Examined: 2 Sentinel. 0 Non-sentinel. 2 Total. Lymph nodes with metastasis: 0. Breast prognostic profile: Performed on previous case [id number] Estrogen receptor: 100%, positive. Progesterone receptor: 100%, positive. Her 2 neu: Positive; average Her-2 signals per nucleus 3.9 ([employer] by FISH). Ki-67: 20%. Non-neoplastic breast: Unremarkable. TNM: pT1a, pN0, MX. Willeen Niece MD Pathologist, Electronic Signature (Case signed 01/20/2014) Specimen Gross and Clinical Information  RADIOGRAPHIC STUDIES:  Nm Sentinel Node Inj-no Rpt (breast)  01/18/2014   CLINICAL DATA: cancer   Sulfur colloid was injected intradermally by the nuclear medicine  technologist for breast cancer sentinel node localization.    Dg Chest Port 1 View  01/18/2014   CLINICAL DATA:  Breast cancer, left breast lumpectomy and axillary lymph node dissection, right chest port catheter insertion  EXAM: PORTABLE CHEST - 1 VIEW  COMPARISON:  02/16/2007  FINDINGS: Single AP portable exam. Postop changes of the left breast  and axilla. right subclavian power port catheter tip SVC RA junction. Low lung volumes noted with vascular congestion and scattered atelectasis. No effusion. Trachea is midline.  IMPRESSION: Right subclavian power port catheter tip SVC RA junction level. No pneumothorax.  Low volume exam with vascular congestion and atelectasis   Electronically Signed   By: Daryll Brod M.D.   On: 01/18/2014 12:47   Dg Fluoro Guide Cv Line-no Report  01/18/2014   CLINICAL DATA: port placement   FLOURO GUIDE CV LINE  Fluoroscopy was utilized by the requesting physician.  No radiographic  interpretation.    Mm Lt Plc Breast Loc Dev   1st Lesion  Inc Mammo Guide  01/18/2014   CLINICAL DATA:  The patient presents for needle localization of 2 lesions within the lateral portion of the left breast.  EXAM: NEEDLE LOCALIZATION OF THE LEFT BREAST WITH MAMMO GUIDANCE  COMPARISON:  Previous exams.  FINDINGS:  Patient presents for needle localization prior to lumpectomy. I met with the patient and we discussed the procedure of needle localization including benefits and alternatives. We discussed the high likelihood of a successful procedure. We discussed the risks of the procedure, including infection, bleeding, tissue injury, and further surgery. Informed, written consent was given. The usual time-out protocol was performed immediately prior to the procedure.  Using mammographic guidance, sterile technique, 2% lidocaine and a 5 cm modified Kopans needle, lesion associated with Tissue marker clip in the lateral portion of the left breast is localized using lateral approach. The films were marked for Dr. Brantley Stage. Using the same technique, the second Tissue marker clip also in the lateral portion of the left breast was also localized.  Specimen radiograph was performed, confirming both clips and both wires to be present in the tissue sample. The specimen was marked for pathology.  IMPRESSION: Needle localization of the left breast. No  apparent complications.   Electronically Signed   By: Shon Hale M.D.   On: 01/18/2014 11:44    ASSESSMENT/PLAN: 53 year old female with  #1 stage I (T1 N0) invasive ductal carcinoma of the left breast diagnosed in January 2015. Biopsy initially revealed that ER positive PR positive HER-2/neu positive breast cancer. She is now status post lumpectomy with sentinel lymph node biopsy. The final pathology revealed a 0.4 cm disease that again is triple positive. All lymph nodes were negative for metastatic disease. Patient is recovering very nicely from her surgery.  #2 patient and I discussed her pathology in detail. She understands now that she will not receive any kind of chemotherapy or antiestrogen therapy due to the favorable nature of the disease especially the size of the tumor. We discussed rationale for it. Unfortunately she does have a Port-A-Cath in place and will have this taken out.  #3 in the meantime I will plan on referring her to Dr. Thea Silversmith for adjuvant radiation therapy. Once she completes the radiation therapy patient will go on antiestrogen therapy with an aromatase inhibitor such as Arimidex 1 mg daily. Patient is postmenopausal. We discussed risks benefits and side effects of aromatase inhibitors.  #4 patient will return after completion of radiation therapy      All questions were answered. The patient knows to call the clinic with any problems, questions or concerns. We can certainly see the patient much sooner if necessary.  I spent 15 minutes counseling the patient face to face. The total time spent in the appointment was 20 minutes.    Marcy Panning, MD Medical/Oncology [facility] [phone] (beeper) [phone] (Office)  02/01/2014, 12:54 PM

## 2014-02-02 ENCOUNTER — Ambulatory Visit: Payer: No Typology Code available for payment source

## 2014-02-04 ENCOUNTER — Encounter: Payer: Self-pay | Admitting: Radiation Oncology

## 2014-02-04 DIAGNOSIS — C50919 Malignant neoplasm of unspecified site of unspecified female breast: Secondary | ICD-10-CM | POA: Insufficient documentation

## 2014-02-07 ENCOUNTER — Other Ambulatory Visit (INDEPENDENT_AMBULATORY_CARE_PROVIDER_SITE_OTHER): Payer: Self-pay | Admitting: Surgery

## 2014-02-08 ENCOUNTER — Ambulatory Visit: Payer: No Typology Code available for payment source

## 2014-02-08 ENCOUNTER — Ambulatory Visit: Payer: No Typology Code available for payment source | Admitting: Oncology

## 2014-02-08 ENCOUNTER — Other Ambulatory Visit: Payer: No Typology Code available for payment source

## 2014-02-08 NOTE — Progress Notes (Signed)
Location of Breast Cancer:Left Breast upper outer quadrant at 3:00 position/DCIS 0.4 cm  Histology per Pathology Report:2/;24/2015  Diagnosis 1. Breast, lumpectomy, Left - INVASIVE GRADE II DUCTAL CARCINOMA WITH CALCIFICATIONS SPANNING 0.4 CM IN GREATEST DIMENSION. - ASSOCIATED INTERMEDIATE GRADE DUCTAL CARCINOMA IN SITU WITH CALCIFICATIONS. - MARGINS ARE NEGATIVE. - SEE ONCOLOGY TEMPLATE. 2. Lymph node, sentinel, biopsy, Left axillary #1 - ONE BENIGN LYMPH NODE WITH NO TUMOR SEEN (0/1). 3. Lymph node, sentinel, biopsy, Left axillary #2 - ONE BENIGN LYMPH NODE WITH NO TUMOR SEEN (0/1). Microscopic Comment    Receptor Status: ER(+), PR (+), Her2-neu (+)  Did patient present with symptoms (if so, please note symptoms) or was this found on screening mammography?Yes, mass revealed in upper outer quadrant at 3:00 position. 2 distinct masses revealed during ultrasound one 8 mm and second 4 mm.  Past/Anticipated interventions by surgeon, if XNT:ZGYF breast needle localized partial mastectomy x 2 and left axillary sentinel lymph node on 01/18/14 by Dr.Cornett.  Past/Anticipated interventions by medical oncology, if any: Chemotherapy:Chemotherapy and anti-HER-2 therapy not required.Anti-estrogen therapy after radiation  Lymphedema issues, if any:No  Pain issues, if any: No  SAFETY ISSUES:  Prior radiation?No  Pacemaker/ICD? No  Possible current pregnancy?No  Is the patient on methotrexate?No  Current Complaints / other detailsMarried :Menarche age 89, menopause in 2011.No HRT.First term birth age 19. 3 children.Sister diagnosed with breast cancer.    Arlyss Repress, RN 02/08/2014,3:24 PM

## 2014-02-09 ENCOUNTER — Encounter: Payer: Self-pay | Admitting: Radiation Oncology

## 2014-02-09 ENCOUNTER — Ambulatory Visit
Admission: RE | Admit: 2014-02-09 | Discharge: 2014-02-09 | Disposition: A | Discharge status: Home / Self Care | Payer: No Typology Code available for payment source | Source: Ambulatory Visit | Attending: Radiation Oncology | Admitting: Radiation Oncology

## 2014-02-09 VITALS — BP 103/53 | HR 68 | Temp 98.6°F | Wt 155.3 lb

## 2014-02-09 DIAGNOSIS — C50412 Malignant neoplasm of upper-outer quadrant of left female breast: Secondary | ICD-10-CM

## 2014-02-09 DIAGNOSIS — C50919 Malignant neoplasm of unspecified site of unspecified female breast: Secondary | ICD-10-CM | POA: Insufficient documentation

## 2014-02-09 DIAGNOSIS — Z79899 Other long term (current) drug therapy: Secondary | ICD-10-CM | POA: Insufficient documentation

## 2014-02-09 NOTE — Progress Notes (Signed)
   Department of Radiation Oncology  Phone:  [phone] Fax:        [phone]   Name: [name] MRN: [id number]  DOB: 1961-02-06  Date: 02/09/2014  Follow Up Visit Note  Diagnosis: T1aN0 Invasive Ductal Carcinoma of the left breast   Interval History: Regina Martinez presents today for routine followup.  She is still having minimal pain from her surgery. She luckily he was found to have a smaller tumor than we had originally suspected with a only 4 mm area it noted in her final pathology specimen. Her margins and her sentinel lymph nodes were negative. She therefore will not require chemotherapy and is ready for radiation. She would like to have her port out if possible. She is accompanied by her niece and her son.   Allergies: No Known Allergies  Medications:  Current Outpatient Prescriptions  Medication Sig Dispense Refill  . Calcium Carb-Cholecalciferol (CALCIUM 600 + D PO) Take 1 tablet by mouth daily.      . Cholecalciferol (VITAMIN D-3 PO) Take 2,000 Int'l Units by mouth daily.      Marland Kitchen lidocaine-prilocaine (EMLA) cream Apply topically as needed.  30 g  6  . LORazepam (ATIVAN) 0.5 MG tablet Take 1 tablet (0.5 mg total) by mouth every 6 (six) hours as needed (Nausea or vomiting).  30 tablet  0  . oxyCODONE-acetaminophen (ROXICET) 5-325 MG per tablet Take 1 tablet by mouth every 4 (four) hours as needed.  30 tablet  0  . ondansetron (ZOFRAN) 4 MG tablet Take 1 tablet (4 mg total) by mouth every 6 (six) hours.  12 tablet  0  . ondansetron (ZOFRAN) 8 MG tablet Take 1 tablet (8 mg total) by mouth 2 (two) times daily. Take two times a day starting the day after chemo for 3 days. Then take two times a day as needed for nausea or vomiting.  30 tablet  1  . prochlorperazine (COMPAZINE) 10 MG tablet Take 1 tablet (10 mg total) by mouth every 6 (six) hours as needed (Nausea or vomiting).  30 tablet  1  . traMADol (ULTRAM) 50 MG tablet Take 1 tablet (50 mg total) by mouth every 6 (six) hours as  needed.  30 tablet  0   No current facility-administered medications for this encounter.    Physical Exam:  Filed Vitals:   02/09/14 1542  BP: 103/53  Pulse: 68  Temp: 98.6 F (37 C)  Weight: 155 lb 4.8 oz (70.444 kg)   well-healed left breast incision. The patient is alert and oriented x3.  IMPRESSION: Regina Martinez is a 53 y.o. female status post lumpectomy  PLAN:  We discussed the process of simulation the placement tattoos. We discussed 6 weeks of treatment as an outpatient. We discussed skin redness and fatigue as the most common side effects. We discussed heart and lung damage as possible side effects although very unlikely. We discussed the low likelihood of secondary malignancies. She has signed informed consent and agree to proceed forward. I will contact her surgeon to see if she can have her Port-A-Cath removed early next week before beginning radiation the following week. She has been scheduled for simulation tomorrow.    Thea Silversmith, MD

## 2014-02-09 NOTE — Progress Notes (Signed)
Please see the Nurse Progress Note in the MD Initial Consult Encounter for this patient. 

## 2014-02-10 ENCOUNTER — Other Ambulatory Visit (INDEPENDENT_AMBULATORY_CARE_PROVIDER_SITE_OTHER): Payer: Self-pay | Admitting: Surgery

## 2014-02-10 ENCOUNTER — Ambulatory Visit
Admission: RE | Admit: 2014-02-10 | Discharge: 2014-02-10 | Disposition: A | Discharge status: Home / Self Care | Payer: No Typology Code available for payment source | Source: Ambulatory Visit | Attending: Radiation Oncology | Admitting: Radiation Oncology

## 2014-02-10 ENCOUNTER — Encounter (HOSPITAL_BASED_OUTPATIENT_CLINIC_OR_DEPARTMENT_OTHER): Payer: Self-pay | Admitting: *Deleted

## 2014-02-10 DIAGNOSIS — C50419 Malignant neoplasm of upper-outer quadrant of unspecified female breast: Secondary | ICD-10-CM | POA: Insufficient documentation

## 2014-02-10 DIAGNOSIS — Z510 Encounter for antineoplastic radiation therapy: Secondary | ICD-10-CM | POA: Diagnosis not present

## 2014-02-10 DIAGNOSIS — C50412 Malignant neoplasm of upper-outer quadrant of left female breast: Secondary | ICD-10-CM

## 2014-02-10 MED ORDER — CEPHALEXIN 500 MG PO CAPS: 500.0000 mg | ORAL_CAPSULE | Freq: Four times a day (QID) | ORAL | Status: DC

## 2014-02-10 NOTE — Addendum Note (Signed)
Encounter addended by: Arlyss Repress, RN on: 02/10/2014  5:18 PM<BR>     Documentation filed: Charges VN

## 2014-02-10 NOTE — Progress Notes (Signed)
Pt here 2/15 for lumpectomy snbx and pac-did not need chemo-to have it out-to come for [facility] labs-per dr Brantley Stage

## 2014-02-10 NOTE — Addendum Note (Signed)
Encounter addended by: Arlyss Repress, RN on: 02/10/2014  9:32 AM<BR>     Documentation filed: Charges VN

## 2014-02-10 NOTE — Progress Notes (Signed)
Name: Regina Martinez   MRN: [id number]  Date:  02/10/2014  DOB: [birth year]  Status:outpatient    DIAGNOSIS: Breast cancer.  CONSENT VERIFIED: yes   SET UP: Patient is setup supine   IMMOBILIZATION:  The following immobilization was used:Custom Moldable Pillow, breast board.   NARRATIVE: Regina Martinez was brought to the [facility].  Identity was confirmed.  All relevant records and images related to the planned course of therapy were reviewed.  Then, the patient was positioned in a stable reproducible clinical set-up for radiation therapy.  Wires were placed to delineate the clinical extent of breast tissue. A wire was placed on the scar as well.  CT images were obtained.  An isocenter was placed. Skin markings were placed.  The CT images were loaded into the planning software where the target and avoidance structures were contoured.  The radiation prescription was entered and confirmed. The patient was discharged in stable condition and tolerated simulation well.    TREATMENT PLANNING NOTE:  Treatment planning then occurred. I have requested : MLC's, isodose plan, basic dose calculation  I personally designed and supervised the construction of 3 medically necessary complex treatment devices for the protection of critical normal structures including the lungs and contralateral breast as well as the immobilization device which is necessary for set up certainty.   After simulation she informed me that she has noticed a rash on the left aspect of her breast. She has a flat reddish rash which is dry and scaly on the lateral aspect of the left breast. She states this images but is not painful. She is not running any fevers not having any chills. She is allergic to any medications. I prescribed Keflex for a superficial skin infection and asked her to get some anti-fungal medication at the pharmacy and used that twice a day.

## 2014-02-11 DIAGNOSIS — Z510 Encounter for antineoplastic radiation therapy: Secondary | ICD-10-CM | POA: Diagnosis not present

## 2014-02-14 ENCOUNTER — Encounter (HOSPITAL_BASED_OUTPATIENT_CLINIC_OR_DEPARTMENT_OTHER)
Admission: RE | Admit: 2014-02-14 | Discharge: 2014-02-14 | Disposition: A | Discharge status: Home / Self Care | Payer: No Typology Code available for payment source | Source: Ambulatory Visit | Attending: Surgery | Admitting: Surgery

## 2014-02-14 DIAGNOSIS — Z01812 Encounter for preprocedural laboratory examination: Secondary | ICD-10-CM | POA: Insufficient documentation

## 2014-02-14 LAB — COMPREHENSIVE METABOLIC PANEL
ALBUMIN: 3.9 g/dL (ref 3.5–5.2)
ALT: 16 U/L (ref 0–35)
AST: 19 U/L (ref 0–37)
Alkaline Phosphatase: 55 U/L (ref 39–117)
BILIRUBIN TOTAL: 0.5 mg/dL (ref 0.3–1.2)
BUN: 9 mg/dL (ref 6–23)
CHLORIDE: 102 meq/L (ref 96–112)
CO2: 27 mEq/L (ref 19–32)
CREATININE: 0.5 mg/dL (ref 0.50–1.10)
Calcium: 9.6 mg/dL (ref 8.4–10.5)
GFR calc Af Amer: >90 mL/min (ref >90)
GFR calc non Af Amer: >90 mL/min (ref >90)
Glucose, Bld: 86 mg/dL (ref 70–99)
Potassium: 3.9 mEq/L (ref 3.7–5.3)
Sodium: 141 mEq/L (ref 137–147)
TOTAL PROTEIN: 7 g/dL (ref 6.0–8.3)

## 2014-02-14 LAB — CBC WITH DIFFERENTIAL/PLATELET
BASOS PCT: 1 % (ref 0–1)
Basophils Absolute: 0 10*3/uL (ref 0.0–0.1)
EOS ABS: 0.3 10*3/uL (ref 0.0–0.7)
Eosinophils Relative: 4 % (ref 0–5)
HEMATOCRIT: 38.4 % (ref 36.0–46.0)
HEMOGLOBIN: 13.4 g/dL (ref 12.0–15.0)
Lymphocytes Relative: 27 % (ref 12–46)
Lymphs Abs: 2.1 10*3/uL (ref 0.7–4.0)
MCH: 31.5 pg (ref 26.0–34.0)
MCHC: 34.9 g/dL (ref 30.0–36.0)
MCV: 90.4 fL (ref 78.0–100.0)
MONO ABS: 0.6 10*3/uL (ref 0.1–1.0)
MONOS PCT: 8 % (ref 3–12)
Neutro Abs: 4.9 10*3/uL (ref 1.7–7.7)
Neutrophils Relative %: 61 % (ref 43–77)
Platelets: 212 10*3/uL (ref 150–400)
RBC: 4.25 MIL/uL (ref 3.87–5.11)
RDW: 13.1 % (ref 11.5–15.5)
WBC: 7.9 10*3/uL (ref 4.0–10.5)

## 2014-02-15 ENCOUNTER — Encounter (HOSPITAL_BASED_OUTPATIENT_CLINIC_OR_DEPARTMENT_OTHER): Payer: Self-pay | Admitting: *Deleted

## 2014-02-15 ENCOUNTER — Other Ambulatory Visit: Payer: No Typology Code available for payment source

## 2014-02-15 ENCOUNTER — Encounter (HOSPITAL_BASED_OUTPATIENT_CLINIC_OR_DEPARTMENT_OTHER)
Admission: RE | Disposition: A | Discharge status: Home / Self Care | Payer: Self-pay | Source: Ambulatory Visit | Attending: Surgery

## 2014-02-15 ENCOUNTER — Ambulatory Visit (HOSPITAL_BASED_OUTPATIENT_CLINIC_OR_DEPARTMENT_OTHER): Payer: No Typology Code available for payment source | Admitting: Anesthesiology

## 2014-02-15 ENCOUNTER — Ambulatory Visit: Payer: No Typology Code available for payment source

## 2014-02-15 ENCOUNTER — Encounter (HOSPITAL_BASED_OUTPATIENT_CLINIC_OR_DEPARTMENT_OTHER): Payer: No Typology Code available for payment source | Admitting: Anesthesiology

## 2014-02-15 ENCOUNTER — Ambulatory Visit (HOSPITAL_BASED_OUTPATIENT_CLINIC_OR_DEPARTMENT_OTHER)
Admission: RE | Admit: 2014-02-15 | Discharge: 2014-02-15 | Disposition: A | Discharge status: Home / Self Care | Payer: No Typology Code available for payment source | Source: Ambulatory Visit | Attending: Surgery | Admitting: Surgery

## 2014-02-15 ENCOUNTER — Ambulatory Visit: Payer: No Typology Code available for payment source | Admitting: Oncology

## 2014-02-15 DIAGNOSIS — Z803 Family history of malignant neoplasm of breast: Secondary | ICD-10-CM | POA: Insufficient documentation

## 2014-02-15 DIAGNOSIS — C50919 Malignant neoplasm of unspecified site of unspecified female breast: Secondary | ICD-10-CM

## 2014-02-15 DIAGNOSIS — Z452 Encounter for adjustment and management of vascular access device: Secondary | ICD-10-CM | POA: Insufficient documentation

## 2014-02-15 DIAGNOSIS — F411 Generalized anxiety disorder: Secondary | ICD-10-CM | POA: Diagnosis not present

## 2014-02-15 DIAGNOSIS — Z510 Encounter for antineoplastic radiation therapy: Secondary | ICD-10-CM | POA: Diagnosis not present

## 2014-02-15 DIAGNOSIS — Z9221 Personal history of antineoplastic chemotherapy: Secondary | ICD-10-CM | POA: Insufficient documentation

## 2014-02-15 DIAGNOSIS — C50412 Malignant neoplasm of upper-outer quadrant of left female breast: Secondary | ICD-10-CM

## 2014-02-15 DIAGNOSIS — Z853 Personal history of malignant neoplasm of breast: Secondary | ICD-10-CM | POA: Diagnosis not present

## 2014-02-15 HISTORY — PX: PORT-A-CATH REMOVAL: SHX5289

## 2014-02-15 SURGERY — REMOVAL PORT-A-CATH
Anesthesia: General | Site: Chest | Laterality: Right

## 2014-02-15 MED ORDER — OXYCODONE HCL 5 MG/5ML PO SOLN: 5.0000 mg | Freq: Once | ORAL | Status: DC | PRN

## 2014-02-15 MED ORDER — LACTATED RINGERS IV SOLN
INTRAVENOUS | Status: DC
  Administered 2014-02-15: 11:00:00 via INTRAVENOUS

## 2014-02-15 MED ORDER — OXYCODONE HCL 5 MG PO TABS: 5.0000 mg | ORAL_TABLET | Freq: Once | ORAL | Status: DC | PRN

## 2014-02-15 MED ORDER — DEXAMETHASONE SODIUM PHOSPHATE 4 MG/ML IJ SOLN
INTRAMUSCULAR | Status: DC | PRN
  Administered 2014-02-15: 10 mg via INTRAVENOUS

## 2014-02-15 MED ORDER — MIDAZOLAM HCL 5 MG/5ML IJ SOLN
INTRAMUSCULAR | Status: DC | PRN
  Administered 2014-02-15: 2 mg via INTRAVENOUS

## 2014-02-15 MED ORDER — CEFAZOLIN SODIUM-DEXTROSE 2-3 GM-% IV SOLR
INTRAVENOUS | Status: AC
Start: 2014-02-15 — End: 2014-02-15
  Filled 2014-02-15: qty 50

## 2014-02-15 MED ORDER — FENTANYL CITRATE 0.05 MG/ML IJ SOLN
INTRAMUSCULAR | Status: AC
  Filled 2014-02-15: qty 2

## 2014-02-15 MED ORDER — BUPIVACAINE-EPINEPHRINE PF 0.25-1:200000 % IJ SOLN
INTRAMUSCULAR | Status: AC
  Filled 2014-02-15: qty 30

## 2014-02-15 MED ORDER — ONDANSETRON HCL 4 MG/2ML IJ SOLN
INTRAMUSCULAR | Status: DC | PRN
  Administered 2014-02-15: 4 mg via INTRAVENOUS

## 2014-02-15 MED ORDER — FENTANYL CITRATE 0.05 MG/ML IJ SOLN: 50.0000 ug | INTRAMUSCULAR | Status: DC | PRN

## 2014-02-15 MED ORDER — CHLORHEXIDINE GLUCONATE 4 % EX LIQD: 1.0000 "application " | Freq: Once | CUTANEOUS | Status: DC

## 2014-02-15 MED ORDER — MIDAZOLAM HCL 2 MG/2ML IJ SOLN
INTRAMUSCULAR | Status: AC
  Filled 2014-02-15: qty 2

## 2014-02-15 MED ORDER — PROMETHAZINE HCL 25 MG/ML IJ SOLN
6.2500 mg | INTRAMUSCULAR | Status: DC | PRN
Start: 2014-02-15 — End: 2014-02-15

## 2014-02-15 MED ORDER — LIDOCAINE HCL (CARDIAC) 20 MG/ML IV SOLN
INTRAVENOUS | Status: DC | PRN
  Administered 2014-02-15: 75 mg via INTRAVENOUS

## 2014-02-15 MED ORDER — PROPOFOL 10 MG/ML IV BOLUS
INTRAVENOUS | Status: DC | PRN
  Administered 2014-02-15: 100 mg via INTRAVENOUS

## 2014-02-15 MED ORDER — MIDAZOLAM HCL 2 MG/2ML IJ SOLN: 1.0000 mg | INTRAMUSCULAR | Status: DC | PRN

## 2014-02-15 MED ORDER — BUPIVACAINE-EPINEPHRINE 0.25% -1:200000 IJ SOLN
INTRAMUSCULAR | Status: DC | PRN
  Administered 2014-02-15: 6 mL

## 2014-02-15 MED ORDER — FENTANYL CITRATE 0.05 MG/ML IJ SOLN
INTRAMUSCULAR | Status: DC | PRN
  Administered 2014-02-15: 100 ug via INTRAVENOUS

## 2014-02-15 MED ORDER — CEFAZOLIN SODIUM-DEXTROSE 2-3 GM-% IV SOLR
2.0000 g | INTRAVENOUS | Status: AC
  Administered 2014-02-15: 2 g via INTRAVENOUS

## 2014-02-15 MED ORDER — HYDROMORPHONE HCL PF 1 MG/ML IJ SOLN: 0.2500 mg | INTRAMUSCULAR | Status: DC | PRN

## 2014-02-15 SURGICAL SUPPLY — 36 items
ADH SKN CLS APL DERMABOND .7 (GAUZE/BANDAGES/DRESSINGS) ×1
APL SKNCLS STERI-STRIP NONHPOA (GAUZE/BANDAGES/DRESSINGS)
BENZOIN TINCTURE PRP APPL 2/3 (GAUZE/BANDAGES/DRESSINGS) IMPLANT
BLADE SURG 15 STRL LF DISP TIS (BLADE) ×1 IMPLANT
BLADE SURG 15 STRL SS (BLADE) ×3
CHLORAPREP W/TINT 26ML (MISCELLANEOUS) ×3 IMPLANT
CLOSURE WOUND 1/2 X4 (GAUZE/BANDAGES/DRESSINGS)
COVER MAYO STAND STRL (DRAPES) ×3 IMPLANT
COVER TABLE BACK 60X90 (DRAPES) ×3 IMPLANT
DECANTER SPIKE VIAL GLASS SM (MISCELLANEOUS) IMPLANT
DERMABOND ADVANCED (GAUZE/BANDAGES/DRESSINGS) ×2
DERMABOND ADVANCED .7 DNX12 (GAUZE/BANDAGES/DRESSINGS) ×1 IMPLANT
DRAPE PED LAPAROTOMY (DRAPES) ×3 IMPLANT
DRAPE UTILITY XL STRL (DRAPES) ×3 IMPLANT
ELECT REM PT RETURN 9FT ADLT (ELECTROSURGICAL) ×3
ELECTRODE REM PT RTRN 9FT ADLT (ELECTROSURGICAL) ×1 IMPLANT
GLOVE BIOGEL PI IND STRL 8 (GLOVE) ×1 IMPLANT
GLOVE BIOGEL PI INDICATOR 8 (GLOVE) ×2
GLOVE ECLIPSE 6.5 STRL STRAW (GLOVE) ×2 IMPLANT
GLOVE ECLIPSE 8.0 STRL XLNG CF (GLOVE) ×3 IMPLANT
GOWN STRL REUS W/ TWL LRG LVL3 (GOWN DISPOSABLE) ×2 IMPLANT
GOWN STRL REUS W/TWL LRG LVL3 (GOWN DISPOSABLE) ×6
NDL HYPO 25X1 1.5 SAFETY (NEEDLE) ×1 IMPLANT
NEEDLE HYPO 25X1 1.5 SAFETY (NEEDLE) ×3 IMPLANT
NS IRRIG 1000ML POUR BTL (IV SOLUTION) ×1 IMPLANT
PACK BASIN DAY SURGERY FS (CUSTOM PROCEDURE TRAY) ×3 IMPLANT
PENCIL BUTTON HOLSTER BLD 10FT (ELECTRODE) ×2 IMPLANT
SLEEVE SCD COMPRESS KNEE MED (MISCELLANEOUS) IMPLANT
SPONGE LAP 4X18 X RAY DECT (DISPOSABLE) ×1 IMPLANT
STRIP CLOSURE SKIN 1/2X4 (GAUZE/BANDAGES/DRESSINGS) IMPLANT
SUT MON AB 4-0 PC3 18 (SUTURE) ×3 IMPLANT
SUT VIC AB 3-0 SH 27 (SUTURE) ×6
SUT VIC AB 3-0 SH 27X BRD (SUTURE) ×2 IMPLANT
SYR CONTROL 10ML LL (SYRINGE) ×3 IMPLANT
TOWEL OR 17X24 6PK STRL BLUE (TOWEL DISPOSABLE) ×3 IMPLANT
TOWEL OR NON WOVEN STRL DISP B (DISPOSABLE) IMPLANT

## 2014-02-15 NOTE — Discharge Instructions (Signed)
Post Anesthesia Home Care Instructions  Activity: Get plenty of rest for the remainder of the day. A responsible adult should stay with you for 24 hours following the procedure.  For the next 24 hours, DO NOT: -Drive a car -Paediatric nurse -Drink alcoholic beverages -Take any medication unless instructed by your physician -Make any legal decisions or sign important papers.  Meals: Start with liquid foods such as gelatin or soup. Progress to regular foods as tolerated. Avoid greasy, spicy, heavy foods. If nausea and/or vomiting occur, drink only clear liquids until the nausea and/or vomiting subsides. Call your physician if vomiting continues.  Special Instructions/Symptoms: Your throat may feel dry or sore from the anesthesia or the breathing tube placed in your throat during surgery. If this causes discomfort, gargle with warm salt water. The discomfort should disappear within 24 hours.      GENERAL SURGERY: POST OP INSTRUCTIONS  1. DIET: Follow a light bland diet the first 24 hours after arrival home, such as soup, liquids, crackers, etc.  Be sure to include lots of fluids daily.  Avoid fast food or heavy meals as your are more likely to get nauseated.   2. Take your usually prescribed home medications unless otherwise directed. 3. PAIN CONTROL: a. Pain is best controlled by a usual combination of three different methods TOGETHER: i. Ice/Heat ii. Over the counter pain medication iii. Prescription pain medication b. Most patients will experience some swelling and bruising around the incisions.  Ice packs or heating pads (30-60 minutes up to 6 times a day) will help. Use ice for the first few days to help decrease swelling and bruising, then switch to heat to help relax tight/sore spots and speed recovery.  Some people prefer to use ice alone, heat alone, alternating between ice & heat.  Experiment to what works for you.  Swelling and bruising can take several weeks to resolve.     c. It is helpful to take an over-the-counter pain medication regularly for the first few weeks.  Choose one of the following that works best for you: i. Naproxen (Aleve, etc)  Two 220mg  tabs twice a day ii. Ibuprofen (Advil, etc) Three 200mg  tabs four times a day (every meal & bedtime) iii. Acetaminophen (Tylenol, etc) 500-650mg  four times a day (every meal & bedtime) d. A  prescription for pain medication (such as oxycodone, hydrocodone, etc) should be given to you upon discharge.  Take your pain medication as prescribed.  i. If you are having problems/concerns with the prescription medicine (does not control pain, nausea, vomiting, rash, itching, etc), please call us [phone] to see if we need to switch you to a different pain medicine that will work better for you and/or control your side effect better. ii. If you need a refill on your pain medication, please contact your pharmacy.  They will contact our office to request authorization. Prescriptions will not be filled after 5 pm or on week-ends. 4. Avoid getting constipated.  Between the surgery and the pain medications, it is common to experience some constipation.  Increasing fluid intake and taking a fiber supplement (such as Metamucil, Citrucel, FiberCon, MiraLax, etc) 1-2 times a day regularly will usually help prevent this problem from occurring.  A mild laxative (prune juice, Milk of Magnesia, MiraLax, etc) should be taken according to package directions if there are no bowel movements after 48 hours.   5. Wash / shower every day.  You may shower over the dressings as they are waterproof.  Continue to shower over incision(s) after the dressing is off. 6. Remove your waterproof bandages 5 days after surgery.  You may leave the incision open to air.  You may have skin tapes (Steri Strips) covering the incision(s).  Leave them on until one week, then remove.  You may replace a dressing/Band-Aid to cover the incision for comfort if you wish.       7. ACTIVITIES as tolerated:   a. You may resume regular (light) daily activities beginning the next day--such as daily self-care, walking, climbing stairs--gradually increasing activities as tolerated.  If you can walk 30 minutes without difficulty, it is safe to try more intense activity such as jogging, treadmill, bicycling, low-impact aerobics, swimming, etc. b. Save the most intensive and strenuous activity for last such as sit-ups, heavy lifting, contact sports, etc  Refrain from any heavy lifting or straining until you are off narcotics for pain control.   c. DO NOT PUSH THROUGH PAIN.  Let pain be your guide: If it hurts to do something, don't do it.  Pain is your body warning you to avoid that activity for another week until the pain goes down. d. You may drive when you are no longer taking prescription pain medication, you can comfortably wear a seatbelt, and you can safely maneuver your car and apply brakes. e. Dennis Bast may have sexual intercourse when it is comfortable.  8. FOLLOW UP in our office a. Please call [facility] at [phone] to set up an appointment to see your surgeon in the office for a follow-up appointment approximately 2-3 weeks after your surgery. b. Make sure that you call for this appointment the day you arrive home to insure a convenient appointment time. 9. IF YOU HAVE DISABILITY OR FAMILY LEAVE FORMS, BRING THEM TO THE OFFICE FOR PROCESSING.  DO NOT GIVE THEM TO YOUR DOCTOR.   WHEN TO CALL us [phone]: 1. Poor pain control 2. Reactions / problems with new medications (rash/itching, nausea, etc)  3. Fever over 101.5 F (38.5 C) 4. Worsening swelling or bruising 5. Continued bleeding from incision. 6. Increased pain, redness, or drainage from the incision 7. Difficulty breathing / swallowing   The clinic staff is available to answer your questions during regular business hours (8:30am-5pm).  Please dont hesitate to call and ask to speak to one of our nurses  for clinical concerns.   If you have a medical emergency, go to the nearest emergency room or call 911.  A surgeon from [facility] is always on call at the [facility], [place], [place], [place], [place]  [phone] ? MAIN: [phone] ? TOLL FREE: [phone] ?  FAX [phone]) [phone] [email or web address]

## 2014-02-15 NOTE — H&P (Signed)
Regina Martinez is an 53 y.o. female.   Chief Complaint: port HPI: pt had port put in for chemotherapy. She does not need this and is here to have it removed.   Past Medical History  Diagnosis Date  . Anxiety   . Wears glasses   . Breast cancer 2015    left, 3 o'clock, triple positive    Past Surgical History  Procedure Laterality Date  . Cesarean section  2000  . Breast lumpectomy with needle localization and axillary sentinel lymph node bx Left 01/18/2014    Procedure: BREAST LUMPECTOMY WITH NEEDLE LOCALIZATION AND AXILLARY SENTINEL LYMPH NODE BIOPSY;  Surgeon: Joyice Faster. Ellena Kamen, MD;  Location: [facility];  Service: General;  Laterality: Left;  . Portacath placement Right 01/18/2014    Procedure: INSERTION PORT-A-CATH;  Surgeon: Joyice Faster. Ramel Tobon, MD;  Location: [facility];  Service: General;  Laterality: Right;    Family History  Problem Relation Age of Onset  . Breast cancer Mother 43  . Breast cancer Sister 19   Social History:  reports that she has never smoked. She does not have any smokeless tobacco history on file. She reports that she does not drink alcohol or use illicit drugs.  Allergies: No Known Allergies  Medications Prior to Admission  Medication Sig Dispense Refill  . Calcium Carb-Cholecalciferol (CALCIUM 600 + D PO) Take 1 tablet by mouth daily.      . Cholecalciferol (VITAMIN D-3 PO) Take 2,000 Int'l Units by mouth daily.      Marland Kitchen LORazepam (ATIVAN) 0.5 MG tablet Take 1 tablet (0.5 mg total) by mouth every 6 (six) hours as needed (Nausea or vomiting).  30 tablet  0  . oxyCODONE-acetaminophen (ROXICET) 5-325 MG per tablet Take 1 tablet by mouth every 4 (four) hours as needed.  30 tablet  0  . ondansetron (ZOFRAN) 4 MG tablet Take 1 tablet (4 mg total) by mouth every 6 (six) hours.  12 tablet  0  . prochlorperazine (COMPAZINE) 10 MG tablet Take 1 tablet (10 mg total) by mouth every 6 (six) hours as needed (Nausea or vomiting).  30 tablet   1    Results for orders placed during the hospital encounter of 02/15/14 (from the past 48 hour(s))  CBC WITH DIFFERENTIAL     Status: None   Collection Time    02/14/14  2:00 PM      Result Value Ref Range   WBC 7.9  4.0 - 10.5 K/uL   RBC 4.25  3.87 - 5.11 MIL/uL   Hemoglobin 13.4  12.0 - 15.0 g/dL   HCT 38.4  36.0 - 46.0 %   MCV 90.4  78.0 - 100.0 fL   MCH 31.5  26.0 - 34.0 pg   MCHC 34.9  30.0 - 36.0 g/dL   RDW 13.1  11.5 - 15.5 %   Platelets 212  150 - 400 K/uL   Neutrophils Relative % 61  43 - 77 %   Neutro Abs 4.9  1.7 - 7.7 K/uL   Lymphocytes Relative 27  12 - 46 %   Lymphs Abs 2.1  0.7 - 4.0 K/uL   Monocytes Relative 8  3 - 12 %   Monocytes Absolute 0.6  0.1 - 1.0 K/uL   Eosinophils Relative 4  0 - 5 %   Eosinophils Absolute 0.3  0.0 - 0.7 K/uL   Basophils Relative 1  0 - 1 %   Basophils Absolute 0.0  0.0 - 0.1  K/uL  COMPREHENSIVE METABOLIC PANEL     Status: None   Collection Time    02/14/14  2:00 PM      Result Value Ref Range   Sodium 141  137 - 147 mEq/L   Potassium 3.9  3.7 - 5.3 mEq/L   Chloride 102  96 - 112 mEq/L   CO2 27  19 - 32 mEq/L   Glucose, Bld 86  70 - 99 mg/dL   BUN 9  6 - 23 mg/dL   Creatinine, Ser 0.50  0.50 - 1.10 mg/dL   Calcium 9.6  8.4 - 10.5 mg/dL   Total Protein 7.0  6.0 - 8.3 g/dL   Albumin 3.9  3.5 - 5.2 g/dL   AST 19  0 - 37 U/L   ALT 16  0 - 35 U/L   Alkaline Phosphatase 55  39 - 117 U/L   Total Bilirubin 0.5  0.3 - 1.2 mg/dL   GFR calc non Af Amer >90  >90 mL/min   GFR calc Af Amer >90  >90 mL/min   Comment: (NOTE)     The eGFR has been calculated using the CKD EPI equation.     This calculation has not been validated in all clinical situations.     eGFR's persistently <90 mL/min signify possible Chronic Kidney     Disease.   No results found.  Review of Systems  Constitutional: Negative.   HENT: Negative.   Respiratory: Negative.   Skin: Negative.     Blood pressure 110/74, pulse 53, temperature 97.6 F (36.4 C),  temperature source Oral, resp. rate 16, height 5' 3"  (1.6 m), weight 155 lb (70.308 kg), last menstrual period 09/16/2012, SpO2 98.00%. Physical Exam  Eyes: Pupils are equal, round, and reactive to light. No scleral icterus.  Neck: Normal range of motion. Neck supple.  Respiratory:       Assessment/Plan Port in place Remove port since it is not necessary.The procedure has been discussed with the patient.  Alternative therapies have been discussed with the patient.  Operative risks include bleeding,  Infection,  Organ injury,  Nerve injury,  Blood vessel injury,  DVT,  Pulmonary embolism,  Death,  And possible reoperation.  Medical management risks include worsening of present situation.  The success of the procedure is 50 -90 % at treating patients symptoms.  The patient understands and agrees to proceed.  Dakota Vanwart A. 02/15/2014, 12:32 PM

## 2014-02-15 NOTE — Anesthesia Postprocedure Evaluation (Signed)
  Anesthesia Post-op Note  Patient: Regina Martinez  Procedure(s) Performed: Procedure(s): REMOVAL PORT-A-CATH (Right)  Patient Location: PACU  Anesthesia Type:General  Level of Consciousness: awake and alert   Airway and Oxygen Therapy: Patient Spontanous Breathing  Post-op Pain: mild  Post-op Assessment: Post-op Vital signs reviewed, Patient's Cardiovascular Status Stable and Respiratory Function Stable  Post-op Vital Signs: Reviewed  Filed Vitals:   02/15/14 1430  BP: 98/64  Pulse: 51  Temp:   Resp: 17    Complications: No apparent anesthesia complications

## 2014-02-15 NOTE — Transfer of Care (Signed)
Immediate Anesthesia Transfer of Care Note  Patient: Regina Martinez  Procedure(s) Performed: Procedure(s): REMOVAL PORT-A-CATH (Right)  Patient Location: PACU  Anesthesia Type:General  Level of Consciousness: awake  Airway & Oxygen Therapy: Patient Spontanous Breathing and Patient connected to face mask oxygen  Post-op Assessment: Report given to PACU RN and Post -op Vital signs reviewed and stable  Post vital signs: Reviewed and stable  Complications: No apparent anesthesia complications

## 2014-02-15 NOTE — Anesthesia Preprocedure Evaluation (Addendum)
Anesthesia Evaluation  Patient identified by MRN, date of birth, ID band Patient awake    Reviewed: Allergy & Precautions, H&P , NPO status , Patient's Chart, lab work & pertinent test results  History of Anesthesia Complications Negative for: history of anesthetic complications  Airway Mallampati: II TM Distance: >3 FB Neck ROM: full    Dental   Pulmonary neg pulmonary ROS,  breath sounds clear to auscultation        Cardiovascular negative cardio ROS  Rhythm:regular Rate:Normal     Neuro/Psych negative neurological ROS  negative psych ROS   GI/Hepatic negative GI ROS, Neg liver ROS,   Endo/Other  negative endocrine ROS  Renal/GU negative Renal ROS     Musculoskeletal   Abdominal   Peds  Hematology   Anesthesia Other Findings   Reproductive/Obstetrics negative OB ROS                          Anesthesia Physical Anesthesia Plan  ASA: II  Anesthesia Plan: General LMA   Post-op Pain Management:    Induction:   Airway Management Planned:   Additional Equipment:   Intra-op Plan:   Post-operative Plan:   Informed Consent: I have reviewed the patients History and Physical, chart, labs and discussed the procedure including the risks, benefits and alternatives for the proposed anesthesia with the patient or authorized representative who has indicated his/her understanding and acceptance.   Dental Advisory Given  Plan Discussed with: Anesthesiologist, CRNA and Surgeon  Anesthesia Plan Comments:         Anesthesia Quick Evaluation

## 2014-02-15 NOTE — Op Note (Signed)
Preop diagnosis: Indwelling port a catheter for chemotherapy right   Postop diagnosis: Same  Procedure: Removal of port a catheter right   Surgeon: Erroll Luna M.D.  Anesthesia: MAC with local  EBL: Minimal  Specimen none  Drains: None  Indications for procedure: The patient presents for removal of port a catheter after completing chemotherapy. The patient no longer requires central venous access. Risks of bleeding, infection, catheter fragmentation, embolization, arrhythmias and damage to arteries, veins and nerves and possibly other mediastinal structures discussed. The patient agrees to proceed.  Description of procedure: The patient was seen in the holding area. Questions were answered. The patient agreed to proceed. The patient was taken to the operating room. The patient was placed supine. Anesthesia was initiated. The skin on the upper chest was prepped and draped in a sterile fashion. Timeout was done. The patient received preoperative antibiotics. Incision was made through the old port site and the hub of the Port-A-Cath was seen. The sutures were cut to release the port from the chest wall. The catheter was removed in its entirety without difficulty. The tract was closed with 3-0 Vicryl. 4 Monocryl was used to close the skin. All final counts were correct. The patient was taken to recovery in satisfactory condition.

## 2014-02-15 NOTE — Interval H&P Note (Signed)
History and Physical Interval Note:  02/15/2014 12:35 PM  Regina Martinez  has presented today for surgery, with the diagnosis of breast cancer  The various methods of treatment have been discussed with the patient and family. After consideration of risks, benefits and other options for treatment, the patient has consented to  Procedure(s): REMOVAL PORT-A-CATH (N/A) as a surgical intervention .  The patient's history has been reviewed, patient examined, no change in status, stable for surgery.  I have reviewed the patient's chart and labs.  Questions were answered to the patient's satisfaction.     Ajax Schroll A.

## 2014-02-16 ENCOUNTER — Encounter (HOSPITAL_BASED_OUTPATIENT_CLINIC_OR_DEPARTMENT_OTHER): Payer: Self-pay | Admitting: Surgery

## 2014-02-17 ENCOUNTER — Ambulatory Visit
Admission: RE | Admit: 2014-02-17 | Discharge: 2014-02-17 | Disposition: A | Discharge status: Home / Self Care | Payer: No Typology Code available for payment source | Source: Ambulatory Visit | Attending: Radiation Oncology | Admitting: Radiation Oncology

## 2014-02-17 DIAGNOSIS — C50412 Malignant neoplasm of upper-outer quadrant of left female breast: Secondary | ICD-10-CM

## 2014-02-17 DIAGNOSIS — Z510 Encounter for antineoplastic radiation therapy: Secondary | ICD-10-CM | POA: Diagnosis not present

## 2014-02-17 NOTE — Progress Notes (Signed)
  Radiation Oncology         [phone] ________________________________  Name: Regina Martinez MRN: [id number]  Date: 02/17/2014  DOB: 1961-10-02  Simulation Verification Note  Status: outpatient  NARRATIVE: The patient was brought to the treatment unit and placed in the planned treatment position. The clinical setup was verified. Then port films were obtained and uploaded to the radiation oncology medical record software.  The treatment beams were carefully compared against the planned radiation fields. The position location and shape of the radiation fields was reviewed. The targeted volume of tissue appears appropriately covered by the radiation beams. Organs at risk appear to be excluded as planned.  Based on my personal review, I approved the simulation verification. The patient's treatment will proceed as planned.  ------------------------------------------------  Thea Silversmith, MD

## 2014-02-21 ENCOUNTER — Ambulatory Visit
Admission: RE | Admit: 2014-02-21 | Discharge: 2014-02-21 | Disposition: A | Discharge status: Home / Self Care | Payer: No Typology Code available for payment source | Source: Ambulatory Visit | Attending: Radiation Oncology | Admitting: Radiation Oncology

## 2014-02-21 DIAGNOSIS — Z510 Encounter for antineoplastic radiation therapy: Secondary | ICD-10-CM | POA: Diagnosis not present

## 2014-02-21 DIAGNOSIS — C50412 Malignant neoplasm of upper-outer quadrant of left female breast: Secondary | ICD-10-CM

## 2014-02-21 MED ORDER — ALRA NON-METALLIC DEODORANT (RAD-ONC)
1.0000 "application " | Freq: Once | TOPICAL | Status: AC
  Administered 2014-02-21: 1 via TOPICAL

## 2014-02-21 MED ORDER — RADIAPLEXRX EX GEL
Freq: Once | CUTANEOUS | Status: AC
  Administered 2014-02-21: 14:00:00 via TOPICAL

## 2014-02-21 NOTE — Progress Notes (Signed)
Post sim ed completed w/pt. Gave pt "Radiation and You" booklet w/all pertinent information marked and discussed, re: fatigue, skin irritation/care, nutrition, pain. Gave pt Radiaplex, Al[place] w/instructions for proper use. Pt verbalized understanding.

## 2014-02-22 ENCOUNTER — Ambulatory Visit: Payer: No Typology Code available for payment source | Admitting: Oncology

## 2014-02-22 ENCOUNTER — Ambulatory Visit
Admission: RE | Admit: 2014-02-22 | Discharge: 2014-02-22 | Disposition: A | Discharge status: Home / Self Care | Payer: No Typology Code available for payment source | Source: Ambulatory Visit | Attending: Radiation Oncology | Admitting: Radiation Oncology

## 2014-02-22 ENCOUNTER — Other Ambulatory Visit: Payer: No Typology Code available for payment source

## 2014-02-22 ENCOUNTER — Telehealth: Payer: Self-pay | Admitting: *Deleted

## 2014-02-22 ENCOUNTER — Ambulatory Visit (HOSPITAL_COMMUNITY)
Admission: RE | Admit: 2014-02-22 | Discharge: 2014-02-22 | Disposition: A | Discharge status: Home / Self Care | Payer: No Typology Code available for payment source | Source: Ambulatory Visit | Attending: Radiation Oncology | Admitting: Radiation Oncology

## 2014-02-22 ENCOUNTER — Encounter: Payer: Self-pay | Admitting: Radiation Oncology

## 2014-02-22 ENCOUNTER — Ambulatory Visit: Payer: No Typology Code available for payment source

## 2014-02-22 VITALS — BP 105/72 | HR 65 | Temp 97.8°F | Resp 20 | Wt 154.6 lb

## 2014-02-22 DIAGNOSIS — C50412 Malignant neoplasm of upper-outer quadrant of left female breast: Secondary | ICD-10-CM

## 2014-02-22 DIAGNOSIS — Z853 Personal history of malignant neoplasm of breast: Secondary | ICD-10-CM | POA: Insufficient documentation

## 2014-02-22 DIAGNOSIS — Z510 Encounter for antineoplastic radiation therapy: Secondary | ICD-10-CM | POA: Diagnosis not present

## 2014-02-22 NOTE — Telephone Encounter (Signed)
Called Regina Martinez and informed her that her chest xray was "clear" and there was not any issues noted. The report stated that there was: "no evidence of residual porta catheter material"

## 2014-02-22 NOTE — Progress Notes (Signed)
Pt denies pain, fatigue, loss of appetite. She has Radiaplex to apply to left breast treatment area.

## 2014-02-22 NOTE — Progress Notes (Signed)
Weekly Management Note Current Dose: 3.6  Gy  Projected Dose: 61 Gy   Narrative:  The patient presents for routine under treatment assessment.  CBCT/MVCT images/Port film x-rays were reviewed.  The chart was checked. Doing well. Port removed. Still feels something under her right skin where portacath was. Concerned about scar tissue vs. Retained catheter. RN education perofrmed yesterday.   Physical Findings: Weight: 154 lb 9.6 oz (70.126 kg). Unchanged  Impression:  The patient is tolerating radiation.  Plan:  Continue treatment as planned. CXR to eval right upper chest.

## 2014-02-23 ENCOUNTER — Ambulatory Visit
Admission: RE | Admit: 2014-02-23 | Discharge: 2014-02-23 | Disposition: A | Discharge status: Home / Self Care | Payer: No Typology Code available for payment source | Source: Ambulatory Visit | Attending: Radiation Oncology | Admitting: Radiation Oncology

## 2014-02-23 ENCOUNTER — Ambulatory Visit: Payer: No Typology Code available for payment source

## 2014-02-23 DIAGNOSIS — Z510 Encounter for antineoplastic radiation therapy: Secondary | ICD-10-CM | POA: Diagnosis not present

## 2014-02-24 ENCOUNTER — Ambulatory Visit
Admission: RE | Admit: 2014-02-24 | Discharge: 2014-02-24 | Disposition: A | Discharge status: Home / Self Care | Payer: No Typology Code available for payment source | Source: Ambulatory Visit | Attending: Radiation Oncology | Admitting: Radiation Oncology

## 2014-02-24 DIAGNOSIS — Z510 Encounter for antineoplastic radiation therapy: Secondary | ICD-10-CM | POA: Diagnosis not present

## 2014-02-25 ENCOUNTER — Ambulatory Visit: Admission: RE | Admit: 2014-02-25 | Payer: No Typology Code available for payment source | Source: Ambulatory Visit

## 2014-02-25 ENCOUNTER — Telehealth: Payer: Self-pay | Admitting: *Deleted

## 2014-02-25 NOTE — Telephone Encounter (Signed)
Mailed after appt letter to pt. 

## 2014-02-28 ENCOUNTER — Ambulatory Visit
Admission: RE | Admit: 2014-02-28 | Discharge: 2014-02-28 | Disposition: A | Discharge status: Home / Self Care | Payer: No Typology Code available for payment source | Source: Ambulatory Visit | Attending: Radiation Oncology | Admitting: Radiation Oncology

## 2014-02-28 DIAGNOSIS — Z510 Encounter for antineoplastic radiation therapy: Secondary | ICD-10-CM | POA: Diagnosis not present

## 2014-03-01 ENCOUNTER — Ambulatory Visit
Admission: RE | Admit: 2014-03-01 | Discharge: 2014-03-01 | Disposition: A | Discharge status: Home / Self Care | Payer: No Typology Code available for payment source | Source: Ambulatory Visit | Attending: Radiation Oncology | Admitting: Radiation Oncology

## 2014-03-01 ENCOUNTER — Encounter: Payer: Self-pay | Admitting: Radiation Oncology

## 2014-03-01 VITALS — BP 94/62 | HR 61 | Temp 98.3°F | Resp 20 | Wt 157.6 lb

## 2014-03-01 DIAGNOSIS — C50412 Malignant neoplasm of upper-outer quadrant of left female breast: Secondary | ICD-10-CM

## 2014-03-01 DIAGNOSIS — Z510 Encounter for antineoplastic radiation therapy: Secondary | ICD-10-CM | POA: Diagnosis not present

## 2014-03-01 NOTE — Progress Notes (Signed)
Pt denies pain, fatigue, loss of appetite. She is applying Radiaplex to left breast treatment area.

## 2014-03-01 NOTE — Progress Notes (Signed)
Weekly Management Note Current Dose: 10.8  Gy  Projected Dose: 61 Gy   Narrative:  The patient presents for routine under treatment assessment.  CBCT/MVCT images/Port film x-rays were reviewed.  The chart was checked. Doing well. No complaints.   Physical Findings: Weight: 157 lb 9.6 oz (71.487 kg). Unchanged. INframammary fold is clear.   Impression:  The patient is tolerating radiation.  Plan:  Continue treatment as planned. Continue radiaplex.

## 2014-03-02 ENCOUNTER — Ambulatory Visit
Admission: RE | Admit: 2014-03-02 | Discharge: 2014-03-02 | Disposition: A | Discharge status: Home / Self Care | Payer: No Typology Code available for payment source | Source: Ambulatory Visit | Attending: Radiation Oncology | Admitting: Radiation Oncology

## 2014-03-02 DIAGNOSIS — Z510 Encounter for antineoplastic radiation therapy: Secondary | ICD-10-CM | POA: Diagnosis not present

## 2014-03-03 ENCOUNTER — Ambulatory Visit
Admission: RE | Admit: 2014-03-03 | Discharge: 2014-03-03 | Disposition: A | Discharge status: Home / Self Care | Payer: No Typology Code available for payment source | Source: Ambulatory Visit | Attending: Radiation Oncology | Admitting: Radiation Oncology

## 2014-03-03 DIAGNOSIS — Z510 Encounter for antineoplastic radiation therapy: Secondary | ICD-10-CM | POA: Diagnosis not present

## 2014-03-04 ENCOUNTER — Ambulatory Visit
Admission: RE | Admit: 2014-03-04 | Discharge: 2014-03-04 | Disposition: A | Discharge status: Home / Self Care | Payer: No Typology Code available for payment source | Source: Ambulatory Visit | Attending: Radiation Oncology | Admitting: Radiation Oncology

## 2014-03-04 DIAGNOSIS — Z510 Encounter for antineoplastic radiation therapy: Secondary | ICD-10-CM | POA: Diagnosis not present

## 2014-03-07 ENCOUNTER — Ambulatory Visit
Admission: RE | Admit: 2014-03-07 | Discharge: 2014-03-07 | Disposition: A | Discharge status: Home / Self Care | Payer: No Typology Code available for payment source | Source: Ambulatory Visit | Attending: Radiation Oncology | Admitting: Radiation Oncology

## 2014-03-07 DIAGNOSIS — Z510 Encounter for antineoplastic radiation therapy: Secondary | ICD-10-CM | POA: Diagnosis not present

## 2014-03-08 ENCOUNTER — Ambulatory Visit
Admission: RE | Admit: 2014-03-08 | Discharge: 2014-03-08 | Disposition: A | Discharge status: Home / Self Care | Payer: No Typology Code available for payment source | Source: Ambulatory Visit | Attending: Radiation Oncology | Admitting: Radiation Oncology

## 2014-03-08 ENCOUNTER — Encounter: Payer: Self-pay | Admitting: Radiation Oncology

## 2014-03-08 VITALS — BP 94/60 | HR 58 | Temp 97.8°F | Resp 20 | Wt 156.1 lb

## 2014-03-08 DIAGNOSIS — C50412 Malignant neoplasm of upper-outer quadrant of left female breast: Secondary | ICD-10-CM

## 2014-03-08 DIAGNOSIS — Z510 Encounter for antineoplastic radiation therapy: Secondary | ICD-10-CM | POA: Diagnosis not present

## 2014-03-08 NOTE — Progress Notes (Signed)
Weekly Management Note Current Dose: 18  Gy  Projected Dose: 45 Gy   Narrative:  The patient presents for routine under treatment assessment.  CBCT/MVCT images/Port film x-rays were reviewed.  The chart was checked. Dging well. No complaints. Using radiaplex.  Physical Findings: Weight: 156 lb 1.6 oz (70.806 kg). Unchanged  Impression:  The patient is tolerating radiation.  Plan:  Continue treatment as planned. Continue radiaplex.

## 2014-03-08 NOTE — Progress Notes (Signed)
Weekly rad txs left breast, 10tx so far, erythema slight,skinintact no c/o pain, nausea, appetite good,  ltttle bit tired at times 3:30 PM

## 2014-03-09 ENCOUNTER — Ambulatory Visit
Admission: RE | Admit: 2014-03-09 | Discharge: 2014-03-09 | Disposition: A | Discharge status: Home / Self Care | Payer: No Typology Code available for payment source | Source: Ambulatory Visit | Attending: Radiation Oncology | Admitting: Radiation Oncology

## 2014-03-09 DIAGNOSIS — Z510 Encounter for antineoplastic radiation therapy: Secondary | ICD-10-CM | POA: Diagnosis not present

## 2014-03-10 ENCOUNTER — Ambulatory Visit
Admission: RE | Admit: 2014-03-10 | Discharge: 2014-03-10 | Disposition: A | Discharge status: Home / Self Care | Payer: No Typology Code available for payment source | Source: Ambulatory Visit | Attending: Radiation Oncology | Admitting: Radiation Oncology

## 2014-03-10 DIAGNOSIS — Z510 Encounter for antineoplastic radiation therapy: Secondary | ICD-10-CM | POA: Diagnosis not present

## 2014-03-11 ENCOUNTER — Ambulatory Visit
Admission: RE | Admit: 2014-03-11 | Discharge: 2014-03-11 | Disposition: A | Discharge status: Home / Self Care | Payer: No Typology Code available for payment source | Source: Ambulatory Visit | Attending: Radiation Oncology | Admitting: Radiation Oncology

## 2014-03-11 DIAGNOSIS — Z510 Encounter for antineoplastic radiation therapy: Secondary | ICD-10-CM | POA: Diagnosis not present

## 2014-03-14 ENCOUNTER — Ambulatory Visit
Admission: RE | Admit: 2014-03-14 | Discharge: 2014-03-14 | Disposition: A | Discharge status: Home / Self Care | Payer: No Typology Code available for payment source | Source: Ambulatory Visit | Attending: Radiation Oncology | Admitting: Radiation Oncology

## 2014-03-14 DIAGNOSIS — Z510 Encounter for antineoplastic radiation therapy: Secondary | ICD-10-CM | POA: Diagnosis not present

## 2014-03-15 ENCOUNTER — Ambulatory Visit
Admission: RE | Admit: 2014-03-15 | Discharge: 2014-03-15 | Disposition: A | Discharge status: Home / Self Care | Payer: No Typology Code available for payment source | Source: Ambulatory Visit | Attending: Radiation Oncology | Admitting: Radiation Oncology

## 2014-03-15 VITALS — BP 96/46 | HR 63 | Temp 98.9°F | Wt 155.1 lb

## 2014-03-15 DIAGNOSIS — C50412 Malignant neoplasm of upper-outer quadrant of left female breast: Secondary | ICD-10-CM

## 2014-03-15 DIAGNOSIS — Z510 Encounter for antineoplastic radiation therapy: Secondary | ICD-10-CM | POA: Diagnosis not present

## 2014-03-15 NOTE — Progress Notes (Signed)
Patient for weekly assessment of radiation to left breast.Completed 16 treatments.Denies pain.Small reddened area of left axilla.continue with application of radiaplex.

## 2014-03-15 NOTE — Progress Notes (Signed)
Weekly Management Note Current Dose: 28.8  Gy  Projected Dose: 61 Gy   Narrative:  The patient presents for routine under treatment assessment.  CBCT/MVCT images/Port film x-rays were reviewed.  The chart was checked. Doing well.  Physical Findings: Weight: 155 lb 1.6 oz (70.353 kg). Redness in the axilla and inframammary folds.  Impression:  The patient is tolerating radiation.  Plan:  Continue treatment as planned. Continue radiaplex.

## 2014-03-16 ENCOUNTER — Ambulatory Visit
Admission: RE | Admit: 2014-03-16 | Discharge: 2014-03-16 | Disposition: A | Discharge status: Home / Self Care | Payer: No Typology Code available for payment source | Source: Ambulatory Visit | Attending: Radiation Oncology | Admitting: Radiation Oncology

## 2014-03-16 ENCOUNTER — Ambulatory Visit: Payer: No Typology Code available for payment source

## 2014-03-16 DIAGNOSIS — Z510 Encounter for antineoplastic radiation therapy: Secondary | ICD-10-CM | POA: Diagnosis not present

## 2014-03-17 ENCOUNTER — Ambulatory Visit
Admission: RE | Admit: 2014-03-17 | Discharge: 2014-03-17 | Disposition: A | Discharge status: Home / Self Care | Payer: No Typology Code available for payment source | Source: Ambulatory Visit | Attending: Radiation Oncology | Admitting: Radiation Oncology

## 2014-03-17 DIAGNOSIS — Z510 Encounter for antineoplastic radiation therapy: Secondary | ICD-10-CM | POA: Diagnosis not present

## 2014-03-18 ENCOUNTER — Ambulatory Visit
Admission: RE | Admit: 2014-03-18 | Discharge: 2014-03-18 | Disposition: A | Discharge status: Home / Self Care | Payer: No Typology Code available for payment source | Source: Ambulatory Visit | Attending: Radiation Oncology | Admitting: Radiation Oncology

## 2014-03-18 DIAGNOSIS — Z510 Encounter for antineoplastic radiation therapy: Secondary | ICD-10-CM | POA: Diagnosis not present

## 2014-03-21 ENCOUNTER — Ambulatory Visit
Admission: RE | Admit: 2014-03-21 | Discharge: 2014-03-21 | Disposition: A | Discharge status: Home / Self Care | Payer: No Typology Code available for payment source | Source: Ambulatory Visit | Attending: Radiation Oncology | Admitting: Radiation Oncology

## 2014-03-21 DIAGNOSIS — Z510 Encounter for antineoplastic radiation therapy: Secondary | ICD-10-CM | POA: Diagnosis not present

## 2014-03-22 ENCOUNTER — Encounter: Payer: Self-pay | Admitting: Radiation Oncology

## 2014-03-22 ENCOUNTER — Ambulatory Visit
Admission: RE | Admit: 2014-03-22 | Discharge: 2014-03-22 | Disposition: A | Discharge status: Home / Self Care | Payer: No Typology Code available for payment source | Source: Ambulatory Visit | Attending: Radiation Oncology | Admitting: Radiation Oncology

## 2014-03-22 VITALS — BP 102/60 | HR 68 | Temp 98.4°F | Resp 20 | Wt 152.1 lb

## 2014-03-22 DIAGNOSIS — Z510 Encounter for antineoplastic radiation therapy: Secondary | ICD-10-CM | POA: Diagnosis not present

## 2014-03-22 DIAGNOSIS — C50412 Malignant neoplasm of upper-outer quadrant of left female breast: Secondary | ICD-10-CM

## 2014-03-22 NOTE — Progress Notes (Signed)
Weekly rad txs, left breast  Erythema, and peelinh under axilla and inframmary fold, slight painful at times, discomfort, uses radiaplex only, asked if using neosporin with pain reilief"no", appetite good, energy level good stated patient, after working 4 hours at [employer] goes home an gets tired 2:44 PM

## 2014-03-22 NOTE — Progress Notes (Signed)
Weekly Management Note Current Dose:  23.4 Gy  Projected Dose: 60.4 Gy   Narrative:  The patient presents for routine under treatment assessment.  CBCT/MVCT images/Port film x-rays were reviewed.  The chart was checked. Itching in axiall and inframammary fold.   Physical Findings: Weight: 152 lb 1.6 oz (68.992 kg). Moist desquamation in inframammary fold. Dry desquamation in axilla.  Impression:  The patient is tolerating radiation.  Plan:  Continue treatment as planned. Neosporin plus pain to inframammary fold. Radiaplex to rest. Discussed need for antiestrogen treatment.

## 2014-03-23 ENCOUNTER — Ambulatory Visit
Admission: RE | Admit: 2014-03-23 | Discharge: 2014-03-23 | Disposition: A | Discharge status: Home / Self Care | Payer: No Typology Code available for payment source | Source: Ambulatory Visit | Attending: Radiation Oncology | Admitting: Radiation Oncology

## 2014-03-23 DIAGNOSIS — Z510 Encounter for antineoplastic radiation therapy: Secondary | ICD-10-CM | POA: Diagnosis not present

## 2014-03-24 ENCOUNTER — Ambulatory Visit
Admission: RE | Admit: 2014-03-24 | Discharge: 2014-03-24 | Disposition: A | Discharge status: Home / Self Care | Payer: No Typology Code available for payment source | Source: Ambulatory Visit | Attending: Radiation Oncology | Admitting: Radiation Oncology

## 2014-03-24 DIAGNOSIS — Z510 Encounter for antineoplastic radiation therapy: Secondary | ICD-10-CM | POA: Diagnosis not present

## 2014-03-25 ENCOUNTER — Ambulatory Visit
Admission: RE | Admit: 2014-03-25 | Discharge: 2014-03-25 | Disposition: A | Discharge status: Home / Self Care | Payer: No Typology Code available for payment source | Source: Ambulatory Visit | Attending: Radiation Oncology | Admitting: Radiation Oncology

## 2014-03-25 DIAGNOSIS — Z510 Encounter for antineoplastic radiation therapy: Secondary | ICD-10-CM | POA: Diagnosis not present

## 2014-03-28 ENCOUNTER — Ambulatory Visit
Admission: RE | Admit: 2014-03-28 | Discharge: 2014-03-28 | Disposition: A | Discharge status: Home / Self Care | Payer: No Typology Code available for payment source | Source: Ambulatory Visit | Attending: Radiation Oncology | Admitting: Radiation Oncology

## 2014-03-28 ENCOUNTER — Ambulatory Visit: Payer: No Typology Code available for payment source

## 2014-03-28 DIAGNOSIS — Z510 Encounter for antineoplastic radiation therapy: Secondary | ICD-10-CM | POA: Diagnosis not present

## 2014-03-29 ENCOUNTER — Ambulatory Visit
Admission: RE | Admit: 2014-03-29 | Discharge: 2014-03-29 | Disposition: A | Discharge status: Home / Self Care | Payer: No Typology Code available for payment source | Source: Ambulatory Visit | Attending: Radiation Oncology | Admitting: Radiation Oncology

## 2014-03-29 ENCOUNTER — Ambulatory Visit: Admission: RE | Admit: 2014-03-29 | Payer: No Typology Code available for payment source | Source: Ambulatory Visit

## 2014-03-29 VITALS — BP 101/63 | HR 57 | Temp 98.4°F | Wt 154.3 lb

## 2014-03-29 DIAGNOSIS — C50412 Malignant neoplasm of upper-outer quadrant of left female breast: Secondary | ICD-10-CM

## 2014-03-29 DIAGNOSIS — Z510 Encounter for antineoplastic radiation therapy: Secondary | ICD-10-CM | POA: Diagnosis not present

## 2014-03-29 NOTE — Progress Notes (Signed)
Weekly assessment of radiation to left breast.Completed 25 of 25 treatments.Start boost tomorrow.Skin is red and peeling with new skin.Denies pain just mild soreness.Mild fatigue.Continue with radiaplex 2 to 3 times daily.

## 2014-03-29 NOTE — Progress Notes (Signed)
Weekly Management Note Current Dose: 47  Gy  Projected Dose: 61 Gy   Narrative:  The patient presents for routine under treatment assessment.  CBCT/MVCT images/Port film x-rays were reviewed.  The chart was checked. Doing well. "sore" under breast and in axilla. Using neosporin.   Physical Findings: Weight: 154 lb 4.8 oz (69.99 kg). Healing moist desquamation in inframammary fold and axilla. Impression:  The patient is tolerating radiation.  Plan:  Continue treatment as planned. Continue neosporin as directed. Areas are no longer in treatment field. Radiaplex to rest of breast.

## 2014-03-29 NOTE — Progress Notes (Signed)
  Radiation Oncology         [phone] ________________________________  Name: Regina Martinez MRN: [id number]  Date: 03/29/2014  DOB: Jul 19, 1961  Simulation Verification Note  Status: outpatient  NARRATIVE: The patient was brought to the treatment unit and placed in the planned treatment position. The clinical setup was verified. Then port films were obtained and uploaded to the radiation oncology medical record software.  The treatment beams were carefully compared against the planned radiation fields. The position location and shape of the radiation fields was reviewed. The targeted volume of tissue appears appropriately covered by the radiation beams. Organs at risk appear to be excluded as planned.  Based on my personal review, I approved the simulation verification. The patient's treatment will proceed as planned.  ------------------------------------------------  Thea Silversmith, MD

## 2014-03-30 ENCOUNTER — Ambulatory Visit
Admission: RE | Admit: 2014-03-30 | Discharge: 2014-03-30 | Disposition: A | Discharge status: Home / Self Care | Payer: No Typology Code available for payment source | Source: Ambulatory Visit | Attending: Radiation Oncology | Admitting: Radiation Oncology

## 2014-03-30 DIAGNOSIS — Z510 Encounter for antineoplastic radiation therapy: Secondary | ICD-10-CM | POA: Diagnosis not present

## 2014-03-31 ENCOUNTER — Ambulatory Visit
Admission: RE | Admit: 2014-03-31 | Discharge: 2014-03-31 | Disposition: A | Discharge status: Home / Self Care | Payer: No Typology Code available for payment source | Source: Ambulatory Visit | Attending: Radiation Oncology | Admitting: Radiation Oncology

## 2014-03-31 DIAGNOSIS — Z510 Encounter for antineoplastic radiation therapy: Secondary | ICD-10-CM | POA: Diagnosis not present

## 2014-04-01 ENCOUNTER — Ambulatory Visit
Admission: RE | Admit: 2014-04-01 | Discharge: 2014-04-01 | Disposition: A | Discharge status: Home / Self Care | Payer: No Typology Code available for payment source | Source: Ambulatory Visit | Attending: Radiation Oncology | Admitting: Radiation Oncology

## 2014-04-01 DIAGNOSIS — Z510 Encounter for antineoplastic radiation therapy: Secondary | ICD-10-CM | POA: Diagnosis not present

## 2014-04-04 ENCOUNTER — Ambulatory Visit
Admission: RE | Admit: 2014-04-04 | Discharge: 2014-04-04 | Disposition: A | Discharge status: Home / Self Care | Payer: No Typology Code available for payment source | Source: Ambulatory Visit | Attending: Radiation Oncology | Admitting: Radiation Oncology

## 2014-04-04 DIAGNOSIS — Z510 Encounter for antineoplastic radiation therapy: Secondary | ICD-10-CM | POA: Diagnosis not present

## 2014-04-05 ENCOUNTER — Ambulatory Visit
Admission: RE | Admit: 2014-04-05 | Discharge: 2014-04-05 | Disposition: A | Discharge status: Home / Self Care | Payer: No Typology Code available for payment source | Source: Ambulatory Visit | Attending: Radiation Oncology | Admitting: Radiation Oncology

## 2014-04-05 ENCOUNTER — Ambulatory Visit: Payer: No Typology Code available for payment source | Admitting: Radiation Oncology

## 2014-04-05 VITALS — BP 91/44 | HR 59 | Temp 98.7°F | Wt 154.0 lb

## 2014-04-05 DIAGNOSIS — Z510 Encounter for antineoplastic radiation therapy: Secondary | ICD-10-CM | POA: Diagnosis not present

## 2014-04-05 DIAGNOSIS — C50412 Malignant neoplasm of upper-outer quadrant of left female breast: Secondary | ICD-10-CM

## 2014-04-05 MED ORDER — RADIAPLEXRX EX GEL
Freq: Once | CUTANEOUS | Status: AC
  Administered 2014-04-05: 16:00:00 via TOPICAL

## 2014-04-05 NOTE — Addendum Note (Signed)
Encounter addended by: Arlyss Repress, RN on: 04/05/2014  3:35 PM<BR>     Documentation filed: Orders

## 2014-04-05 NOTE — Progress Notes (Signed)
Weekly assessment of radiation to left breast..Completes treatment on Thursday.Reviewed discharge instructions on skin care.Will give additional tube of radiaplex today to apply 2 to 3 times daily.Patient has appointment for June to see dr.Kahn regarding anti-estrogen therapy.Informed patient she may should receive call from medical oncology prior to this appointment.Given one appointment card to schedule one month follow up with Dr.Wentworth.

## 2014-04-05 NOTE — Addendum Note (Signed)
Encounter addended by: Arlyss Repress, RN on: 04/05/2014  4:02 PM<BR>     Documentation filed: Inpatient MAR

## 2014-04-05 NOTE — Progress Notes (Signed)
Weekly Management Note Current Dose: 57  Gy  Projected Dose: 61 Gy   Narrative:  The patient presents for routine under treatment assessment.  CBCT/MVCT images/Port film x-rays were reviewed.  The chart was checked. Doing really well. Skin in inframammary fold is healing. Some irritation remains in axilla.   Physical Findings: Weight: 154 lb (69.854 kg). Dry inframammary fold. Dry desqumation in axilla. Rest of breast is pink.   Impression:  The patient is tolerating radiation.  Plan:  Continue treatment as planned. Continue radiaplex.

## 2014-04-06 ENCOUNTER — Ambulatory Visit
Admission: RE | Admit: 2014-04-06 | Discharge: 2014-04-06 | Disposition: A | Discharge status: Home / Self Care | Payer: No Typology Code available for payment source | Source: Ambulatory Visit | Attending: Radiation Oncology | Admitting: Radiation Oncology

## 2014-04-06 DIAGNOSIS — Z510 Encounter for antineoplastic radiation therapy: Secondary | ICD-10-CM | POA: Diagnosis not present

## 2014-04-07 ENCOUNTER — Ambulatory Visit
Admission: RE | Admit: 2014-04-07 | Discharge: 2014-04-07 | Disposition: A | Discharge status: Home / Self Care | Payer: No Typology Code available for payment source | Source: Ambulatory Visit | Attending: Radiation Oncology | Admitting: Radiation Oncology

## 2014-04-07 ENCOUNTER — Encounter: Payer: Self-pay | Admitting: Radiation Oncology

## 2014-04-07 DIAGNOSIS — Z510 Encounter for antineoplastic radiation therapy: Secondary | ICD-10-CM | POA: Diagnosis not present

## 2014-04-10 NOTE — Progress Notes (Signed)
  Radiation Oncology         (336) [phone] ________________________________  Name: Regina Martinez MRN: [id number]  Date: 04/07/2014  DOB: 10/26/1961  End of Treatment Note  Diagnosis:   T1aN0 Right breast cancer     Indication for treatment:  Curative       Radiation treatment dates:   02/21/2014-04/07/2014  Site/dose:   Left breast/ 45 Gy at 1.8 Gy per fraction x 25 fractions.  Left breast boost/ 16 Gy at 2 Gy per fraction x 8 fractions  Beams/energy:  Opposed tangents with reduced fields / 6 MV photons 3 field photon boost/ 15 and 10 MV photons.   Narrative: The patient tolerated radiation treatment relatively well.   She had some moist desquamation in the inframammary fold which healed by the time she started her boost. She was able to continue working during treatment. Breath hold technique using AlignRT was utilized to spare the cardiac vessels.   Plan: The patient has completed radiation treatment. The patient will return to radiation oncology clinic for routine followup in one month. I advised her to call or return sooner if she has any questions or concerns related to recovery or treatment.  ------------------------------------------------  Thea Silversmith, MD

## 2014-04-12 ENCOUNTER — Other Ambulatory Visit (HOSPITAL_COMMUNITY): Payer: Self-pay | Admitting: Internal Medicine

## 2014-04-12 ENCOUNTER — Encounter (HOSPITAL_COMMUNITY): Payer: Self-pay

## 2014-04-12 ENCOUNTER — Ambulatory Visit (HOSPITAL_COMMUNITY)
Admission: RE | Admit: 2014-04-12 | Discharge: 2014-04-12 | Disposition: A | Discharge status: Home / Self Care | Payer: No Typology Code available for payment source | Source: Ambulatory Visit | Attending: Family Medicine | Admitting: Family Medicine

## 2014-04-12 ENCOUNTER — Ambulatory Visit (HOSPITAL_BASED_OUTPATIENT_CLINIC_OR_DEPARTMENT_OTHER)
Admission: RE | Admit: 2014-04-12 | Discharge: 2014-04-12 | Disposition: A | Discharge status: Home / Self Care | Payer: No Typology Code available for payment source | Source: Ambulatory Visit | Attending: Family Medicine | Admitting: Family Medicine

## 2014-04-12 VITALS — BP 108/56 | HR 58 | Wt 154.8 lb

## 2014-04-12 DIAGNOSIS — C50919 Malignant neoplasm of unspecified site of unspecified female breast: Secondary | ICD-10-CM

## 2014-04-12 NOTE — Progress Notes (Signed)
*  PRELIMINARY RESULTS* Echocardiogram 2D Echocardiogram has been performed.  Regina Martinez 04/12/2014, 11:47 AM

## 2014-04-12 NOTE — Progress Notes (Signed)
Patient ID: Regina Martinez, female   DOB: [birth year], 53 y.o.   MRN: [id number] Oncologist: Dr. Humphrey Rolls  53 yo with left breast cancer T1N) ER+,PR+,NER-2/neu+   Initially scheduled for 6 cycles Taxotere/carboplatin with weekly Herceptin followed by Herceptin every 3 wks x 1 year. However underwent lumpectomy and sentinel node biopsy and results were good so treated only with XRT. Which she completed last week.   I reviewed her echo today, there were no significant abnormalities.   PMH: 1. Breast cancer: ER+,PR+,NER-2/neu+.   2. Echocardiogram (2/15) with EF 55-60%, global longitudinal strain -22%, lateral s' 9.6 cm/sec, no significant valvular abnormalities.      Echo 04/12/14: EF 60% GLS -23.9 lateral s' 9.7  SH: Nonsmoker, married, no ETOH.   FH: No history of cardiomyopathy or premature CAD.   ROS: All systems reviewed and negative except as per HPI.   Current Outpatient Prescriptions  Medication Sig Dispense Refill  . Calcium Carb-Cholecalciferol (CALCIUM 600 + D PO) Take 1 tablet by mouth daily.      . Cholecalciferol (VITAMIN D-3 PO) Take 2,000 Int'l Units by mouth daily.      . hyaluronate sodium (RADIAPLEXRX) GEL Apply 1 application topically 2 (two) times daily.      Marland Kitchen LORazepam (ATIVAN) 0.5 MG tablet Take 1 tablet (0.5 mg total) by mouth every 6 (six) hours as needed (Nausea or vomiting).  30 tablet  0  . non-metallic deodorant (ALRA) MISC Apply 1 application topically daily as needed.      . ondansetron (ZOFRAN) 4 MG tablet Take 1 tablet (4 mg total) by mouth every 6 (six) hours.  12 tablet  0  . prochlorperazine (COMPAZINE) 10 MG tablet Take 1 tablet (10 mg total) by mouth every 6 (six) hours as needed (Nausea or vomiting).  30 tablet  1   No current facility-administered medications for this encounter.    BP 108/56  Pulse 58  Wt 154 lb 12.8 oz (70.217 kg)  SpO2 95%  LMP 09/16/2012 General: NAD Neck: No JVD, no thyromegaly or thyroid nodule.  Lungs: Clear to auscultation  bilaterally with normal respiratory effort. CV: Nondisplaced PMI.  Heart regular S1/S2, no S3/S4, no murmur.  No peripheral edema.  No carotid bruit.  Normal pedal pulses.  Abdomen: Soft, nontender, no hepatosplenomegaly, no distention.  Skin: Intact without lesions or rashes.  Neurologic: Alert and oriented x 3.  Psych: Normal affect. Extremities: No clubbing or cyanosis.  HEENT: Normal.   Assessment/Plan: 1) Breast CA  I reviewed echo today. All parameters look great. She will not be receiving chemotherapy at this time. Can f/u with [place] on prn basis.   Shaune Pascal Bensimhon MD 55/19/2015 12:04 PM

## 2014-04-16 NOTE — Progress Notes (Signed)
Radiation Oncology         (336) [phone] ________________________________  Name: Regina Martinez      MRN: [id number]          Date: 02/10/14              DOB: 1960-12-17  Optical Surface Tracking Plan:  Since intensity modulated radiotherapy (IMRT) and 3D conformal radiation treatment methods are predicated on accurate and precise positioning for treatment, intrafraction motion monitoring is medically necessary to ensure accurate and safe treatment delivery.  The ability to quantify intrafraction motion without excessive ionizing radiation dose can only be performed with optical surface tracking. Accordingly, surface imaging offers the opportunity to obtain 3D measurements of patient position throughout IMRT and 3D treatments without excessive radiation exposure.  I am ordering optical surface tracking for this patient's upcoming course of radiotherapy. ________________________________ Signature   Reference:   Ursula Alert, J, et al. Surface imaging-based analysis of intrafraction motion for breast radiotherapy patients.Journal of [place], n. 6, nov. 2014. ISSN [id number].   Available at: <http://www.jacmp.org/index.php/jacmp/article/view/4957>.

## 2014-04-16 NOTE — Progress Notes (Signed)
Name: Regina Martinez   MRN: [id number]  Date:  03/15/14   DOB: 07-17-1961  Status:outpatient    DIAGNOSIS: Breast cancer.  CONSENT VERIFIED: yes   SET UP: Patient is setup supine   IMMOBILIZATION:  The following immobilization was used:Custom Moldable Pillow, breast board.   NARRATIVE: [name] underwent complex simulation and treatment planning for her boost treatment today.  Her tumor volume was outlined on the planning CT scan.  Due to the depth of her cavity, electrons could not be used and a photon plan was developed. The plan will be prescribed to the 99% isodose line.   I personally supervised and approved the creation of 3 unique MLCs comprising 3   treatment devices which will be used to limit dose to the rest of her breast and normal structures including the heart and lungs.

## 2014-05-06 ENCOUNTER — Telehealth: Payer: Self-pay | Admitting: Oncology

## 2014-05-06 NOTE — Telephone Encounter (Signed)
, °

## 2014-05-10 ENCOUNTER — Other Ambulatory Visit: Payer: Self-pay

## 2014-05-10 ENCOUNTER — Ambulatory Visit: Payer: Self-pay | Admitting: Oncology

## 2014-05-11 ENCOUNTER — Other Ambulatory Visit: Payer: Self-pay | Admitting: Oncology

## 2014-05-17 ENCOUNTER — Other Ambulatory Visit (HOSPITAL_BASED_OUTPATIENT_CLINIC_OR_DEPARTMENT_OTHER): Payer: No Typology Code available for payment source

## 2014-05-17 ENCOUNTER — Telehealth: Payer: Self-pay | Admitting: Hematology and Oncology

## 2014-05-17 ENCOUNTER — Ambulatory Visit (HOSPITAL_BASED_OUTPATIENT_CLINIC_OR_DEPARTMENT_OTHER): Payer: No Typology Code available for payment source | Admitting: Hematology and Oncology

## 2014-05-17 VITALS — BP 104/68 | HR 60 | Temp 98.2°F | Resp 20 | Ht 63.0 in | Wt 156.4 lb

## 2014-05-17 DIAGNOSIS — C50419 Malignant neoplasm of upper-outer quadrant of unspecified female breast: Secondary | ICD-10-CM

## 2014-05-17 DIAGNOSIS — Z170 Estrogen receptor positive status [ER+]: Secondary | ICD-10-CM

## 2014-05-17 DIAGNOSIS — C50412 Malignant neoplasm of upper-outer quadrant of left female breast: Secondary | ICD-10-CM

## 2014-05-17 LAB — CBC WITH DIFFERENTIAL/PLATELET
BASO%: 0.9 % (ref 0.0–2.0)
Basophils Absolute: 0 10*3/uL (ref 0.0–0.1)
EOS ABS: 0.2 10*3/uL (ref 0.0–0.5)
EOS%: 3.8 % (ref 0.0–7.0)
HEMATOCRIT: 41.5 % (ref 34.8–46.6)
HGB: 13.9 g/dL (ref 11.6–15.9)
LYMPH%: 22.1 % (ref 14.0–49.7)
MCH: 31 pg (ref 25.1–34.0)
MCHC: 33.6 g/dL (ref 31.5–36.0)
MCV: 92.2 fL (ref 79.5–101.0)
MONO#: 0.4 10*3/uL (ref 0.1–0.9)
MONO%: 8.6 % (ref 0.0–14.0)
NEUT#: 2.8 10*3/uL (ref 1.5–6.5)
NEUT%: 64.6 % (ref 38.4–76.8)
PLATELETS: 168 10*3/uL (ref 145–400)
RBC: 4.5 10*6/uL (ref 3.70–5.45)
RDW: 13.1 % (ref 11.2–14.5)
WBC: 4.3 10*3/uL (ref 3.9–10.3)
lymph#: 1 10*3/uL (ref 0.9–3.3)

## 2014-05-17 LAB — COMPREHENSIVE METABOLIC PANEL (CC13)
ALBUMIN: 3.9 g/dL (ref 3.5–5.0)
ALK PHOS: 54 U/L (ref 40–150)
ALT: 18 U/L (ref 0–55)
AST: 19 U/L (ref 5–34)
Anion Gap: 8 mEq/L (ref 3–11)
BUN: 12.4 mg/dL (ref 7.0–26.0)
CHLORIDE: 106 meq/L (ref 98–109)
CO2: 27 mEq/L (ref 22–29)
Calcium: 9.1 mg/dL (ref 8.4–10.4)
Creatinine: 0.7 mg/dL (ref 0.6–1.1)
Glucose: 102 mg/dl (ref 70–140)
Potassium: 4 mEq/L (ref 3.5–5.1)
Sodium: 141 mEq/L (ref 136–145)
Total Bilirubin: 0.87 mg/dL (ref 0.20–1.20)
Total Protein: 6.7 g/dL (ref 6.4–8.3)

## 2014-05-17 MED ORDER — ANASTROZOLE 1 MG PO TABS: 1.0000 mg | ORAL_TABLET | Freq: Every day | ORAL | Status: DC

## 2014-05-17 NOTE — Telephone Encounter (Signed)
per pof to sch BD-gave pt time date & location-pt understood

## 2014-05-17 NOTE — Telephone Encounter (Signed)
perpof to add 27mth appt to sch-sch & gave pt copy of sch

## 2014-05-20 ENCOUNTER — Ambulatory Visit (HOSPITAL_COMMUNITY): Payer: No Typology Code available for payment source

## 2014-05-23 ENCOUNTER — Ambulatory Visit (HOSPITAL_COMMUNITY)
Admission: RE | Admit: 2014-05-23 | Discharge: 2014-05-23 | Disposition: A | Discharge status: Home / Self Care | Payer: No Typology Code available for payment source | Source: Ambulatory Visit | Attending: Hematology and Oncology | Admitting: Hematology and Oncology

## 2014-05-23 DIAGNOSIS — Z13820 Encounter for screening for osteoporosis: Secondary | ICD-10-CM | POA: Insufficient documentation

## 2014-05-23 DIAGNOSIS — C50412 Malignant neoplasm of upper-outer quadrant of left female breast: Secondary | ICD-10-CM

## 2014-05-23 DIAGNOSIS — C50419 Malignant neoplasm of upper-outer quadrant of unspecified female breast: Secondary | ICD-10-CM | POA: Insufficient documentation

## 2014-05-23 DIAGNOSIS — Z780 Asymptomatic menopausal state: Secondary | ICD-10-CM | POA: Insufficient documentation

## 2014-05-27 NOTE — Progress Notes (Signed)
OFFICE PROGRESS NOTE  CC  [name], MD [address] [place] [address] Dr. Marcello Moores Cornett Dr. Thea Silversmith  CHIEF COMPLAINT:  F/u visit for breast cancer  DIAGNOSIS: stage I left breast cancer that was ER positive PR positive HER-2/neu positive.  Patient was originally seen at the multidisciplinary breast clinic on 12/22/2013.  PRIOR THERAPY: From original intake note:  #1Underwent a screening mammogram that revealed a left breast mass with calcifications in October 2014. This mass was in the posterior aspect of the upper outer quadrant at the 3:00 position. Patient went on to have an ultrasound of the left breast that did reveal 2 distinct masses one measuring 8 mm and the second measuring 4 mm. There was also noted to be a lymph node which was biopsied and it was negative for metastatic cancer. 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 She had MRI of the breasts performed that showed 2 areas with a full measurement at 1.4 cm. She was also noted to have 2 cm area of clumped nodular enhancement. Corresponding to the areas of biopsy-proven DCIS/invasive ductal carcinoma. There was no lymphadenopathy identified  #2 patient is now status post left breast lumpectomy with sentinel lymph node biopsy performed on 01/18/2014. Patient also had a Port-A-Cath placed at the same time. Her final pathology revealed Breast, lumpectomy, Left - INVASIVE GRADE II DUCTAL CARCINOMA WITH CALCIFICATIONS SPANNING 0.4 CM IN GREATEST DIMENSION. - ASSOCIATED INTERMEDIATE GRADE DUCTAL CARCINOMA IN SITU WITH CALCIFICATIONS. - MARGINS ARE NEGATIVE. - SEE ONCOLOGY TEMPLATE. 2. Lymph node, sentinel, biopsy, Left axillary #1 - ONE BENIGN LYMPH NODE WITH NO TUMOR SEEN (0/1). 3. Lymph node, sentinel, biopsy, Left axillary #2 - ONE BENIGN LYMPH NODE WITH NO TUMOR  SEEN (0/1).   INTERVAL HISTORY: December Regina Martinez 53 y.o. female returns for followup visit for breast cancer. She completed adjuvant radiation therapy on May 14th. She says she tolerated radiation treatment well. She is here to discuss antiestrogen therapy today.  she denies any headaches,  blurring of vision ,fevers ,chills, night sweats. No shortness of breath chest pains palpitations. No abdominal pain no diarrhea or constipation. She has no easy bruising or bleeding. She has no myalgias and arthralgias. No peripheral paresthesias or gait disturbances.  Remainder of the 10 point review of systems is negative.   MEDICAL HISTORY: Past Medical History  Diagnosis Date  . Anxiety   . Wears glasses   . Breast cancer 2015    left, 3 o'clock, triple positive    ALLERGIES:  has No Known Allergies.  MEDICATIONS:  Current Outpatient Prescriptions  Medication Sig Dispense Refill  . Calcium Carb-Cholecalciferol (CALCIUM 600 + D PO) Take 1 tablet by mouth daily.      . Cholecalciferol (VITAMIN D-3 PO) Take 2,000 Int'l Units by mouth daily.      . hyaluronate sodium (RADIAPLEXRX) GEL Apply 1 application topically 2 (two) times daily.      . ondansetron (ZOFRAN) 4 MG tablet Take 1 tablet (4 mg total) by mouth every 6 (six) hours.  12 tablet  0  . anastrozole (ARIMIDEX) 1 MG tablet Take 1 tablet (1 mg total) by mouth daily.  90 tablet  2  . non-metallic deodorant (ALRA) MISC Apply 1 application topically daily as needed.       No current facility-administered medications for this visit.    SURGICAL HISTORY:  Past Surgical History  Procedure Laterality Date  . Cesarean section  2000  . Breast lumpectomy with needle localization and axillary sentinel lymph node bx Left 01/18/2014    Procedure: BREAST LUMPECTOMY WITH NEEDLE LOCALIZATION AND AXILLARY SENTINEL LYMPH NODE BIOPSY;  Surgeon: Joyice Faster. Cornett, MD;  Location: [facility];  Service: General;  Laterality: Left;  . Portacath  placement Right 01/18/2014    Procedure: INSERTION PORT-A-CATH;  Surgeon: Joyice Faster. Cornett, MD;  Location: [facility];  Service: General;  Laterality: Right;  . Port-a-cath removal Right 02/15/2014    Procedure: REMOVAL PORT-A-CATH;  Surgeon: Joyice Faster. Cornett, MD;  Location: [facility];  Service: General;  Laterality: Right;    REVIEW OF SYSTEMS:  Pertinent symptoms as mentioned in interval history   PHYSICAL EXAMINATION: Blood pressure 104/68, pulse 60, temperature 98.2 F (36.8 C), temperature source Oral, resp. rate 20, height 5' 3" (1.6 m), weight 156 lb 6.4 oz (70.943 kg), last menstrual period 09/16/2012. Body mass index is 27.71 kg/(m^2). ECOG PERFORMANCE STATUS: 0 - Asymptomatic   General appearance: alert, cooperative and appears stated age Lymph nodes: Cervical, supraclavicular, and axillary nodes normal. Resp: clear to auscultation bilaterally Back: symmetric, no curvature. ROM normal. No CVA tenderness. Cardio: regular rate and rhythm GI: soft, non-tender; bowel sounds normal; no masses,  no organomegaly Extremities: extremities normal, atraumatic, no cyanosis or edema Neurologic: Grossly normal Breasts: right breast normal without mass, skin or nipple changes or axillary nodes,  left breast : s/p lumpectomy scar noted. No massess noted. No axillary lymphadenopathy appreciated   LABORATORY DATA: Lab Results  Component Value Date   WBC 4.3 05/17/2014   HGB 13.9 05/17/2014   HCT 41.5 05/17/2014   MCV 92.2 05/17/2014   PLT 168 05/17/2014      Chemistry      Component Value Date/Time   NA 141 05/17/2014 0821   NA 141 02/14/2014 1400   K 4.0 05/17/2014 0821   K 3.9 02/14/2014 1400   CL 102 02/14/2014 1400   CO2 27 05/17/2014 0821   CO2 27 02/14/2014 1400   BUN 12.4 05/17/2014 0821   BUN 9 02/14/2014 1400   CREATININE 0.7 05/17/2014 0821   CREATININE 0.50 02/14/2014 1400      Component Value Date/Time   CALCIUM 9.1 05/17/2014 0821   CALCIUM 9.6  02/14/2014 1400   ALKPHOS 54 05/17/2014 0821   ALKPHOS 55 02/14/2014 1400   AST 19 05/17/2014 0821   AST 19 02/14/2014 1400   ALT 18 05/17/2014 0821   ALT 16 02/14/2014 1400   BILITOT 0.87 05/17/2014 0821   BILITOT 0.5 02/14/2014 1400     Diagnosis 1. Breast, lumpectomy, Left - INVASIVE GRADE II DUCTAL CARCINOMA WITH CALCIFICATIONS SPANNING 0.4 CM IN GREATEST DIMENSION. - ASSOCIATED INTERMEDIATE GRADE DUCTAL CARCINOMA IN SITU WITH CALCIFICATIONS. - MARGINS ARE NEGATIVE. - SEE ONCOLOGY TEMPLATE. 2. Lymph node, sentinel, biopsy, Left axillary #1 - ONE BENIGN LYMPH NODE WITH NO TUMOR SEEN (0/1). 3. Lymph node, sentinel, biopsy, Left axillary #2 - ONE BENIGN LYMPH NODE WITH NO TUMOR SEEN (0/1). Microscopic Comment 1. BREAST, INVASIVE TUMOR, WITH LYMPH NODE SAMPLING Specimen, including laterality and lymph node sampling (sentinel, non-sentinel): Left partial breast with left sentinel lymph node sampling Procedure: Left breast lumpectomy with left sentinel lymph node biopsies. Histologic type: Invasive ductal carcinoma Grade: II. Tubule formation: 3. Nuclear pleomorphism: 2. Mitotic: 2. Tumor size (glass slide measurement): 0.4 cm. Margins: Invasive, distance to closest margin: 0.2 cm (  anterior margin). In-situ, distance to closest margin: Greater than 0.5 cm. Lymphovascular invasion: Definitive lymphovascular invasion is not identified. Ductal carcinoma in situ: Yes. Grade: Intermediate grade. Extensive intraductal component: No. Lobular neoplasia: No. Tumor focality: Unifocal. Treatment effect: N/A. Extent of tumor: Tumor confined to breast parenchyma. 1 of 3 FINAL for Gallo, [place] [phone]) Microscopic Comment(continued) Skin: Not received. Nipple: Not received. Skeletal muscle: Not received. Lymph nodes: Examined: 2 Sentinel. 0 Non-sentinel. 2 Total. Lymph nodes with metastasis: 0. Breast prognostic profile: Performed on previous case [id number] Estrogen receptor:  100%, positive. Progesterone receptor: 100%, positive. Her 2 neu: Positive; average Her-2 signals per nucleus 3.9 ([employer] by FISH). Ki-67: 20%. Non-neoplastic breast: Unremarkable. TNM: pT1a, pN0, MX. Willeen Niece MD Pathologist, Electronic Signature (Case signed 01/20/2014) Specimen Gross and Clinical Information  RADIOGRAPHIC STUDIES:  Nm Sentinel Node Inj-no Rpt (breast)  01/18/2014   CLINICAL DATA: cancer   Sulfur colloid was injected intradermally by the nuclear medicine  technologist for breast cancer sentinel node localization.    Dg Chest Port 1 View  01/18/2014   CLINICAL DATA:  Breast cancer, left breast lumpectomy and axillary lymph node dissection, right chest port catheter insertion  EXAM: PORTABLE CHEST - 1 VIEW  COMPARISON:  02/16/2007  FINDINGS: Single AP portable exam. Postop changes of the left breast and axilla. right subclavian power port catheter tip SVC RA junction. Low lung volumes noted with vascular congestion and scattered atelectasis. No effusion. Trachea is midline.  IMPRESSION: Right subclavian power port catheter tip SVC RA junction level. No pneumothorax.  Low volume exam with vascular congestion and atelectasis   Electronically Signed   By: Daryll Brod M.D.   On: 01/18/2014 12:47   Dg Fluoro Guide Cv Line-no Report  01/18/2014   CLINICAL DATA: port placement   FLOURO GUIDE CV LINE  Fluoroscopy was utilized by the requesting physician.  No radiographic  interpretation.    Mm Lt Plc Breast Loc Dev   1st Lesion  Inc Mammo Guide  01/18/2014   CLINICAL DATA:  The patient presents for needle localization of 2 lesions within the lateral portion of the left breast.  EXAM: NEEDLE LOCALIZATION OF THE LEFT BREAST WITH MAMMO GUIDANCE  COMPARISON:  Previous exams.  FINDINGS: Patient presents for needle localization prior to lumpectomy. I met with the patient and we discussed the procedure of needle localization including benefits and alternatives. We discussed the  high likelihood of a successful procedure. We discussed the risks of the procedure, including infection, bleeding, tissue injury, and further surgery. Informed, written consent was given. The usual time-out protocol was performed immediately prior to the procedure.  Using mammographic guidance, sterile technique, 2% lidocaine and a 5 cm modified Kopans needle, lesion associated with Tissue marker clip in the lateral portion of the left breast is localized using lateral approach. The films were marked for Dr. Brantley Stage. Using the same technique, the second Tissue marker clip also in the lateral portion of the left breast was also localized.  Specimen radiograph was performed, confirming both clips and both wires to be present in the tissue sample. The specimen was marked for pathology.  IMPRESSION: Needle localization of the left breast. No apparent complications.   Electronically Signed   By: Shon Hale M.D.   On: 01/18/2014 11:44    ASSESSMENT/PLAN: 54 year old female with  #1 stage I (T1 N0) invasive ductal carcinoma of the left breast diagnosed in January 2015. Biopsy initially revealed that ER positive PR positive HER-2/neu positive breast cancer. She is  now status post lumpectomy with sentinel lymph node biopsy. The final pathology revealed a 0.4 cm disease that again is triple positive. All lymph nodes were negative for metastatic disease.   #2 Completed Adjuvant radiation therapy on 04/07/2014  #3 I have discussed  antiestrogen therapy with an aromatase inhibitor in detail. Discussed the benefits and side effects of  Aromatase inhibitors. She understood the same and would like to proceed with Arimidex. Will initiate Arimedex 1 mg po once daily.   #4. We will arrange for baseline Dexa scan   Next F/u visit in 3 months with CBC and diff, CMP   All questions were answered. The patient knows to call the clinic with any problems, questions or concerns. We can certainly see the patient much sooner if  necessary.  I spent 90mnutes counseling the patient face to face. The total time spent in the appointment was 30 minutes.   PWilmon Arms MD Medical/Oncology [phone](Office)  05/27/2014, 6:27 PM

## 2014-08-19 ENCOUNTER — Other Ambulatory Visit: Payer: Self-pay | Admitting: *Deleted

## 2014-08-19 DIAGNOSIS — C50412 Malignant neoplasm of upper-outer quadrant of left female breast: Secondary | ICD-10-CM

## 2014-08-22 ENCOUNTER — Ambulatory Visit (HOSPITAL_BASED_OUTPATIENT_CLINIC_OR_DEPARTMENT_OTHER): Payer: No Typology Code available for payment source | Admitting: Hematology and Oncology

## 2014-08-22 ENCOUNTER — Encounter: Payer: Self-pay | Admitting: Hematology and Oncology

## 2014-08-22 ENCOUNTER — Telehealth: Payer: Self-pay | Admitting: Hematology and Oncology

## 2014-08-22 ENCOUNTER — Other Ambulatory Visit (HOSPITAL_BASED_OUTPATIENT_CLINIC_OR_DEPARTMENT_OTHER): Payer: No Typology Code available for payment source

## 2014-08-22 VITALS — BP 107/55 | HR 48 | Temp 98.1°F | Resp 20 | Ht 63.0 in | Wt 157.2 lb

## 2014-08-22 DIAGNOSIS — M949 Disorder of cartilage, unspecified: Secondary | ICD-10-CM

## 2014-08-22 DIAGNOSIS — C50919 Malignant neoplasm of unspecified site of unspecified female breast: Secondary | ICD-10-CM

## 2014-08-22 DIAGNOSIS — M899 Disorder of bone, unspecified: Secondary | ICD-10-CM

## 2014-08-22 DIAGNOSIS — C50412 Malignant neoplasm of upper-outer quadrant of left female breast: Secondary | ICD-10-CM

## 2014-08-22 DIAGNOSIS — Z170 Estrogen receptor positive status [ER+]: Secondary | ICD-10-CM

## 2014-08-22 LAB — COMPREHENSIVE METABOLIC PANEL (CC13)
ALK PHOS: 53 U/L (ref 40–150)
ALT: 14 U/L (ref 0–55)
AST: 18 U/L (ref 5–34)
Albumin: 3.9 g/dL (ref 3.5–5.0)
Anion Gap: 8 mEq/L (ref 3–11)
BILIRUBIN TOTAL: 0.56 mg/dL (ref 0.20–1.20)
BUN: 7.9 mg/dL (ref 7.0–26.0)
CO2: 29 mEq/L (ref 22–29)
Calcium: 9.5 mg/dL (ref 8.4–10.4)
Chloride: 105 mEq/L (ref 98–109)
Creatinine: 0.7 mg/dL (ref 0.6–1.1)
GLUCOSE: 99 mg/dL (ref 70–140)
Potassium: 3.9 mEq/L (ref 3.5–5.1)
Sodium: 142 mEq/L (ref 136–145)
TOTAL PROTEIN: 7 g/dL (ref 6.4–8.3)

## 2014-08-22 LAB — CBC WITH DIFFERENTIAL/PLATELET
BASO%: 0.7 % (ref 0.0–2.0)
Basophils Absolute: 0 10*3/uL (ref 0.0–0.1)
EOS ABS: 0.2 10*3/uL (ref 0.0–0.5)
EOS%: 4.4 % (ref 0.0–7.0)
HCT: 39.1 % (ref 34.8–46.6)
HGB: 13.4 g/dL (ref 11.6–15.9)
LYMPH%: 29.1 % (ref 14.0–49.7)
MCH: 31.2 pg (ref 25.1–34.0)
MCHC: 34.3 g/dL (ref 31.5–36.0)
MCV: 91.1 fL (ref 79.5–101.0)
MONO#: 0.3 10*3/uL (ref 0.1–0.9)
MONO%: 6.2 % (ref 0.0–14.0)
NEUT%: 59.6 % (ref 38.4–76.8)
NEUTROS ABS: 2.6 10*3/uL (ref 1.5–6.5)
PLATELETS: 161 10*3/uL (ref 145–400)
RBC: 4.29 10*6/uL (ref 3.70–5.45)
RDW: 12.7 % (ref 11.2–14.5)
WBC: 4.3 10*3/uL (ref 3.9–10.3)
lymph#: 1.3 10*3/uL (ref 0.9–3.3)

## 2014-08-22 NOTE — Progress Notes (Signed)
Patient Care Team: [name], MD as PCP - General (Family Medicine)  DIAGNOSIS: Breast cancer of upper-outer quadrant of left female breast   Primary site: Breast (Left)   Staging method: AJCC 7th Edition   Clinical: Stage IA (T1c, N0, cM0)   Summary: Stage IA (T1c, N0, cM0)   Clinical comments: Staged at breast conference 12/22/13.   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 N0 M0 stage IA   12/21/2013 Breast MRI Left breast 3:00 position: 2 cm area of clumped nodular enhancement   01/18/2014 Surgery Left breast lumpectomy: Invasive ductal carcinoma grade 20.4 cm with intermediate grade DCIS 0/2 SLN   02/21/2014 - 04/07/2014 Radiation Therapy Radiation therapy to lumpectomy site   05/27/2014 -  Anti-estrogen oral therapy Arimidex 1 mg by mouth daily    CHIEF COMPLIANT: Three-month followup after starting Arimidex  INTERVAL HISTORY: Regina Martinez is a 53 year old lady with above-mentioned history of left-sided breast cancer treated with lumpectomy followed by radiation. She had a very small HER-2 positive breast cancer and she was not offered adjuvant systemic chemotherapy because it was 0.4 cm in size. After radiation therapy, she started Arimidex and is tolerating it very well without any major problems or concerns. She does have occasional hot flashes and occasional myalgias but other than that she is without any issues. She had blood work today as well. She has 3 children, one is a Financial planner the other at [employer] and another in high school.   REVIEW OF SYSTEMS:   Constitutional: Denies fevers, chills  or abnormal weight loss Eyes: Denies blurriness of vision Ears, nose, mouth, throat, and face: Denies mucositis or sore throat Respiratory: Denies cough, dyspnea or wheezes Cardiovascular: Denies palpitation, chest discomfort or lower extremity swelling Gastrointestinal:  Denies nausea, heartburn or change in bowel habits Skin: Denies abnormal skin rashes Lymphatics: Denies new lymphadenopathy or easy bruising Neurological:Denies numbness, tingling or new weaknesses Behavioral/Psych: Mood is stable, no new changes  Breast:  denies any pain or lumps or nodules in either breasts All other systems were reviewed with the patient and are negative.  I have reviewed the past medical history, past surgical history, social history and family history with the patient and they are unchanged from previous note.  ALLERGIES:  has No Known Allergies.  MEDICATIONS:  Current Outpatient Prescriptions  Medication Sig Dispense Refill  . anastrozole (ARIMIDEX) 1 MG tablet Take 1 tablet (1 mg total) by mouth daily.  90 tablet  2  . Calcium Carb-Cholecalciferol (CALCIUM 600 + D PO) Take 1 tablet by mouth daily.      . Cholecalciferol (VITAMIN D-3 PO) Take 2,000 Int'l Units by mouth daily.      . hyaluronate sodium (RADIAPLEXRX) GEL Apply 1 application topically 2 (two) times daily.      . non-metallic deodorant Jethro Poling) MISC Apply 1 application topically daily as needed.       No current facility-administered medications for this visit.    PHYSICAL EXAMINATION: ECOG PERFORMANCE STATUS: 1 - Symptomatic but completely ambulatory  Filed Vitals:   08/22/14 1052  BP: 107/55  Pulse: 48  Temp:  98.1 F (36.7 C)  Resp: 20   Filed Weights   08/22/14 1052  Weight: 157 lb 3.2 oz (71.305 kg)    GENERAL:alert, no distress and comfortable SKIN: skin color, texture, turgor are normal, no rashes or significant lesions EYES: normal, Conjunctiva are pink and non-injected, sclera clear OROPHARYNX:no exudate, no  erythema and lips, buccal mucosa, and tongue normal  NECK: supple, thyroid normal size, non-tender, without nodularity LYMPH:  no palpable lymphadenopathy in the cervical, axillary or inguinal LUNGS: clear to auscultation and percussion with normal breathing effort HEART: regular rate & rhythm and no murmurs and no lower extremity edema ABDOMEN:abdomen soft, non-tender and normal bowel sounds Musculoskeletal:no cyanosis of digits and no clubbing  NEURO: alert & oriented x 3 with fluent speech, no focal motor/sensory deficits  LABORATORY DATA:  I have reviewed the data as listed   Chemistry      Component Value Date/Time   NA 142 08/22/2014 1041   NA 141 02/14/2014 1400   K 3.9 08/22/2014 1041   K 3.9 02/14/2014 1400   CL 102 02/14/2014 1400   CO2 29 08/22/2014 1041   CO2 27 02/14/2014 1400   BUN 7.9 08/22/2014 1041   BUN 9 02/14/2014 1400   CREATININE 0.7 08/22/2014 1041   CREATININE 0.50 02/14/2014 1400      Component Value Date/Time   CALCIUM 9.5 08/22/2014 1041   CALCIUM 9.6 02/14/2014 1400   ALKPHOS 53 08/22/2014 1041   ALKPHOS 55 02/14/2014 1400   AST 18 08/22/2014 1041   AST 19 02/14/2014 1400   ALT 14 08/22/2014 1041   ALT 16 02/14/2014 1400   BILITOT 0.56 08/22/2014 1041   BILITOT 0.5 02/14/2014 1400       Lab Results  Component Value Date   WBC 4.3 08/22/2014   HGB 13.4 08/22/2014   HCT 39.1 08/22/2014   MCV 91.1 08/22/2014   PLT 161 08/22/2014   NEUTROABS 2.6 08/22/2014     RADIOGRAPHIC STUDIES: No results found.   ASSESSMENT & PLAN:  Breast cancer of upper-outer quadrant of left female breast 1 stage I (T1 N0) invasive ductal carcinoma of the left breast diagnosed in January 2015. Biopsy initially revealed that ER positive PR positive HER-2/neu positive breast cancer. status post lumpectomy with sentinel lymph node biopsy. The final pathology revealed a 0.4 cm disease that again is triple positive. All lymph nodes were negative for metastatic disease. Completed Adjuvant  radiation therapy on 04/07/2014, centimeters anastrozole 1 mg daily July 2015  2. monitoring closely for toxicities related to antiestrogen therapy. She does not have any major complaints other than mild hot flashes and occasional myalgias.  3. DEXA scan showed osteopenia with a T score -1.1. I encouraged her to continue with calcium and vitamin D. We will watch this with every other year bone density test.  4. surveillance: Patient will be set up for a mammogram end of October. I would like to see her back in 3 months for followup on the mammogram to do a breast exam and to monitor toxicities to antiestrogen therapy. If her tests all come back normal, we will see her after that every 6 months.  5. Survivorship:Discussed the importance of physical exercise in decreasing the likelihood of breast cancer recurrence. Recommended 30 mins daily 6 days a week of either brisk walking or cycling or swimming. Encouraged patient to eat more fruits and vegetables and decrease red meat.      Orders Placed This Encounter  Procedures  . MM  Digital Diagnostic Bilat    Standing Status: Future     Number of Occurrences:      Standing Expiration Date: 08/22/2015    Order Specific Question:  Reason for Exam (SYMPTOM  OR DIAGNOSIS REQUIRED)    Answer:  Left breast cancer and status post lumpectomy and annual follow up    Order Specific Question:  Is the patient pregnant?    Answer:  No    Order Specific Question:  Preferred imaging location?    Answer:  [facility]   The patient has a good understanding of the overall plan. she agrees with it. She will call with any problems that may develop before her next visit here.  I spent 15 minutes counseling the patient face to face. The total time spent in the appointment was 20 minutes and more than 50% was on counseling and review of test results    Rulon Eisenmenger, MD 08/22/2014 11:24 AM

## 2014-08-22 NOTE — Assessment & Plan Note (Addendum)
1 stage I (T1 N0) invasive ductal carcinoma of the left breast diagnosed in January 2015. Biopsy initially revealed that ER positive PR positive HER-2/neu positive breast cancer. status post lumpectomy with sentinel lymph node biopsy. The final pathology revealed a 0.4 cm disease that again is triple positive. All lymph nodes were negative for metastatic disease. Completed Adjuvant radiation therapy on 04/07/2014, centimeters anastrozole 1 mg daily July 2015  2. monitoring closely for toxicities related to antiestrogen therapy. She does not have any major complaints other than mild hot flashes and occasional myalgias.  3. DEXA scan showed osteopenia with a T score -1.1. I encouraged her to continue with calcium and vitamin D. We will watch this with every other year bone density test.  4. surveillance: Patient will be set up for a mammogram end of October. I would like to see her back in 3 months for followup on the mammogram to do a breast exam and to monitor toxicities to antiestrogen therapy. If her tests all come back normal, we will see her after that every 6 months.  5. Survivorship:Discussed the importance of physical exercise in decreasing the likelihood of breast cancer recurrence. Recommended 30 mins daily 6 days a week of either brisk walking or cycling or swimming. Encouraged patient to eat more fruits and vegetables and decrease red meat.

## 2014-08-22 NOTE — Telephone Encounter (Signed)
per pof to sch pt appt/mamma-gave pt copy of sch °

## 2014-09-23 ENCOUNTER — Ambulatory Visit
Admission: RE | Admit: 2014-09-23 | Discharge: 2014-09-23 | Disposition: A | Discharge status: Home / Self Care | Payer: No Typology Code available for payment source | Source: Ambulatory Visit | Attending: Hematology and Oncology | Admitting: Hematology and Oncology

## 2014-09-23 DIAGNOSIS — C50412 Malignant neoplasm of upper-outer quadrant of left female breast: Secondary | ICD-10-CM

## 2014-11-28 ENCOUNTER — Ambulatory Visit (HOSPITAL_BASED_OUTPATIENT_CLINIC_OR_DEPARTMENT_OTHER): Payer: No Typology Code available for payment source | Admitting: Hematology and Oncology

## 2014-11-28 DIAGNOSIS — C50812 Malignant neoplasm of overlapping sites of left female breast: Secondary | ICD-10-CM

## 2014-11-28 DIAGNOSIS — Z170 Estrogen receptor positive status [ER+]: Secondary | ICD-10-CM

## 2014-11-28 DIAGNOSIS — C50412 Malignant neoplasm of upper-outer quadrant of left female breast: Secondary | ICD-10-CM

## 2014-11-28 NOTE — Assessment & Plan Note (Signed)
1 stage I (T1 N0) invasive ductal carcinoma of the left breast diagnosed in January 2015. Biopsy initially revealed that ER positive PR positive HER-2/neu positive breast cancer. status post lumpectomy with sentinel lymph node biopsy. The final pathology revealed a 0.4 cm disease that again is triple positive. All lymph nodes were negative for metastatic disease. Completed Adjuvant radiation therapy on 04/07/2014,  anastrozole 1 mg daily July 2015   2. monitoring closely for toxicities related to antiestrogen therapy. Patient has mild stiffness in the fingers in the morning along with tingling of the tips of the fingers. She attributes these to being on antiestrogen therapy. I encouraged her to do more exercises in the fingers to alleviate the stiffness.  3. Breast cancer surveillance:  A. mammogram done 09/23/2014 normal  B. breast exam done 11/28/2014 normal   Return to clinic in 6 months for follow-up

## 2014-11-28 NOTE — Progress Notes (Signed)
Patient Care Team: [name], MD as PCP - General (Family Medicine)  DIAGNOSIS: Breast cancer of upper-outer quadrant of left female breast   Staging form: Breast, AJCC 7th Edition     Clinical: Stage IA (T1c, N0, cM0) - Unsigned       Staging comments: Staged at breast conference 12/22/13.      Pathologic: No stage assigned - Unsigned   SUMMARY OF ONCOLOGIC HISTORY:   Breast cancer of upper-outer quadrant of left female breast   09/21/2013 Mammogram Left breast mass with calcifications upper outer quadrant, ultrasound 2 distinct masses 8 mm and 4 mm (could be LN)   12/16/2013 Initial Diagnosis Breast cancer of upper-outer quadrant of left female breast: Grade 2 invasive ductal carcinoma ER 100% PR 100% HER-2 positive ratio 3.9 Ki-67 20% and 28%;  T1 A. N0 M0 stage IA   12/21/2013 Breast MRI Left breast 3:00 position: 2 cm area of clumped nodular enhancement   01/18/2014 Surgery Left breast lumpectomy: Invasive ductal carcinoma grade 20.4 cm with intermediate grade DCIS 0/2 SLN   02/21/2014 - 04/07/2014 Radiation Therapy Radiation therapy to lumpectomy site   05/27/2014 -  Anti-estrogen oral therapy Arimidex 1 mg by mouth daily    CHIEF COMPLIANT: Follow-up on antiestrogen therapy  INTERVAL HISTORY: Regina Martinez is a 54 year old lady with above-mentioned history of left-sided breast cancer treated with lumpectomy and radiation is currently on Arimidex therapy started July 2015. She has been on this medicine for about 6 months. She complains of mild tingling of the tips of the fingers as well as stiffness in the hands in the morning. All of the symptoms improved with activity. Denies any significant hot flashes.  REVIEW OF SYSTEMS:   Constitutional: Denies fevers, chills or abnormal weight loss Eyes: Denies blurriness of vision Ears, nose, mouth, throat, and face: Denies mucositis or sore throat Respiratory: Denies cough, dyspnea or wheezes Cardiovascular: Denies palpitation, chest  discomfort or lower extremity swelling Gastrointestinal:  Denies nausea, heartburn or change in bowel habits Skin: Denies abnormal skin rashes Lymphatics: Denies new lymphadenopathy or easy bruising Neurological: Mild numbness in the tips of the fingers in the morning and gets better through the day Behavioral/Psych: Mood is stable, no new changes  Breast:  denies any pain or lumps or nodules in either breasts All other systems were reviewed with the patient and are negative.  I have reviewed the past medical history, past surgical history, social history and family history with the patient and they are unchanged from previous note.  ALLERGIES:  has No Known Allergies.  MEDICATIONS:  Current Outpatient Prescriptions  Medication Sig Dispense Refill  . anastrozole (ARIMIDEX) 1 MG tablet Take 1 tablet (1 mg total) by mouth daily. 90 tablet 2  . Calcium Carb-Cholecalciferol (CALCIUM 600 + D PO) Take 1 tablet by mouth daily.    . Cholecalciferol (VITAMIN D-3 PO) Take 2,000 Int'l Units by mouth daily.    . hyaluronate sodium (RADIAPLEXRX) GEL Apply 1 application topically 2 (two) times daily.    . non-metallic deodorant Jethro Poling) MISC Apply 1 application topically daily as needed.     No current facility-administered medications for this visit.    PHYSICAL EXAMINATION: ECOG PERFORMANCE STATUS: 0 - Asymptomatic  Filed Vitals:   11/28/14 0923  BP: 108/49  Pulse: 62  Temp: 98.1 F (36.7 C)  Resp: 18   Filed Weights   11/28/14 0923  Weight: 157 lb 14.4 oz (71.623 kg)    GENERAL:alert, no distress and comfortable SKIN:  skin color, texture, turgor are normal, no rashes or significant lesions EYES: normal, Conjunctiva are pink and non-injected, sclera clear OROPHARYNX:no exudate, no erythema and lips, buccal mucosa, and tongue normal  NECK: supple, thyroid normal size, non-tender, without nodularity LYMPH:  no palpable lymphadenopathy in the cervical, axillary or inguinal LUNGS: clear  to auscultation and percussion with normal breathing effort HEART: regular rate & rhythm and no murmurs and no lower extremity edema ABDOMEN:abdomen soft, non-tender and normal bowel sounds Musculoskeletal:no cyanosis of digits and no clubbing  NEURO: alert & oriented x 3 with fluent speech, no focal motor/sensory deficits BREAST: No palpable masses or nodules in either right or left breasts. Left axillary scar is palpated slightly tender but no nodularity. No palpable axillary supraclavicular or infraclavicular adenopathy no breast tenderness or nipple discharge.   LABORATORY DATA:  I have reviewed the data as listed   Chemistry      Component Value Date/Time   NA 142 08/22/2014 1041   NA 141 02/14/2014 1400   K 3.9 08/22/2014 1041   K 3.9 02/14/2014 1400   CL 102 02/14/2014 1400   CO2 29 08/22/2014 1041   CO2 27 02/14/2014 1400   BUN 7.9 08/22/2014 1041   BUN 9 02/14/2014 1400   CREATININE 0.7 08/22/2014 1041   CREATININE 0.50 02/14/2014 1400      Component Value Date/Time   CALCIUM 9.5 08/22/2014 1041   CALCIUM 9.6 02/14/2014 1400   ALKPHOS 53 08/22/2014 1041   ALKPHOS 55 02/14/2014 1400   AST 18 08/22/2014 1041   AST 19 02/14/2014 1400   ALT 14 08/22/2014 1041   ALT 16 02/14/2014 1400   BILITOT 0.56 08/22/2014 1041   BILITOT 0.5 02/14/2014 1400       Lab Results  Component Value Date   WBC 4.3 08/22/2014   HGB 13.4 08/22/2014   HCT 39.1 08/22/2014   MCV 91.1 08/22/2014   PLT 161 08/22/2014   NEUTROABS 2.6 08/22/2014    ASSESSMENT & PLAN:  Breast cancer of upper-outer quadrant of left female breast 1 stage I (T1 N0) invasive ductal carcinoma of the left breast diagnosed in January 2015. Biopsy initially revealed that ER positive PR positive HER-2/neu positive breast cancer. status post lumpectomy with sentinel lymph node biopsy. The final pathology revealed a 0.4 cm disease that again is triple positive. All lymph nodes were negative for metastatic disease.  Completed Adjuvant radiation therapy on 04/07/2014,  anastrozole 1 mg daily July 2015   2. monitoring closely for toxicities related to antiestrogen therapy. Patient has mild stiffness in the fingers in the morning along with tingling of the tips of the fingers. She attributes these to being on antiestrogen therapy. I encouraged her to do more exercises in the fingers to alleviate the stiffness.  3. Breast cancer surveillance:  A. mammogram done 09/23/2014 normal  B. breast exam done 11/28/2014 normal   Return to clinic in 6 months for follow-up      Orders Placed This Encounter  Procedures  . CBC with Differential    Standing Status: Future     Number of Occurrences:      Standing Expiration Date: 11/28/2015  . Comprehensive metabolic panel (Cmet) - [facility]    Standing Status: Future     Number of Occurrences:      Standing Expiration Date: 11/28/2015   The patient has a good understanding of the overall plan. she agrees with it. She will call with any problems that may develop before her next  visit here.   Rulon Eisenmenger, MD 11/28/2014 9:39 AM

## 2014-11-29 ENCOUNTER — Telehealth: Payer: Self-pay | Admitting: Hematology and Oncology

## 2014-11-29 NOTE — Telephone Encounter (Signed)
, °

## 2015-03-15 ENCOUNTER — Other Ambulatory Visit: Payer: Self-pay | Admitting: *Deleted

## 2015-03-15 ENCOUNTER — Telehealth: Payer: Self-pay

## 2015-03-15 DIAGNOSIS — C50412 Malignant neoplasm of upper-outer quadrant of left female breast: Secondary | ICD-10-CM

## 2015-03-15 MED ORDER — ANASTROZOLE 1 MG PO TABS: 1.0000 mg | ORAL_TABLET | Freq: Every day | ORAL | Status: DC

## 2015-03-15 MED ORDER — GABAPENTIN 100 MG PO CAPS: 100.0000 mg | ORAL_CAPSULE | Freq: Three times a day (TID) | ORAL | Status: DC

## 2015-03-15 NOTE — Progress Notes (Signed)
Pt came in to clinic requesting to see Dr. Lindi Adie for refill of Arimidex and tingling in her fingertips.  Let pt know prescription refill was sent today, her copay and that pharmacist stated it is ready for pickup.  I let patient know I would talk with Dr. Lindi Adie about the neuropathy and call her.  Pt voiced understanding.

## 2015-03-15 NOTE — Telephone Encounter (Signed)
Per Dr. Lindi Adie, gabapentin 100 mg TID.  Ordered - receipt confirmed.  Pt notified prescription at pharmacy and to try for a month to see if it helps.  Pt voiced understanding.

## 2015-03-27 IMAGING — MG MM LT PLC BREAST LOC DEV 1ST LESION
4 series · 4 of 4 positions shown · non-contrast
Comparison: Previous exams.

CLINICAL DATA: The patient presents for needle localization of 2
lesions within the lateral portion of the left breast.

EXAM:
NEEDLE LOCALIZATION OF THE LEFT BREAST WITH MAMMO GUIDANCE

[L CC (1 of 2)]
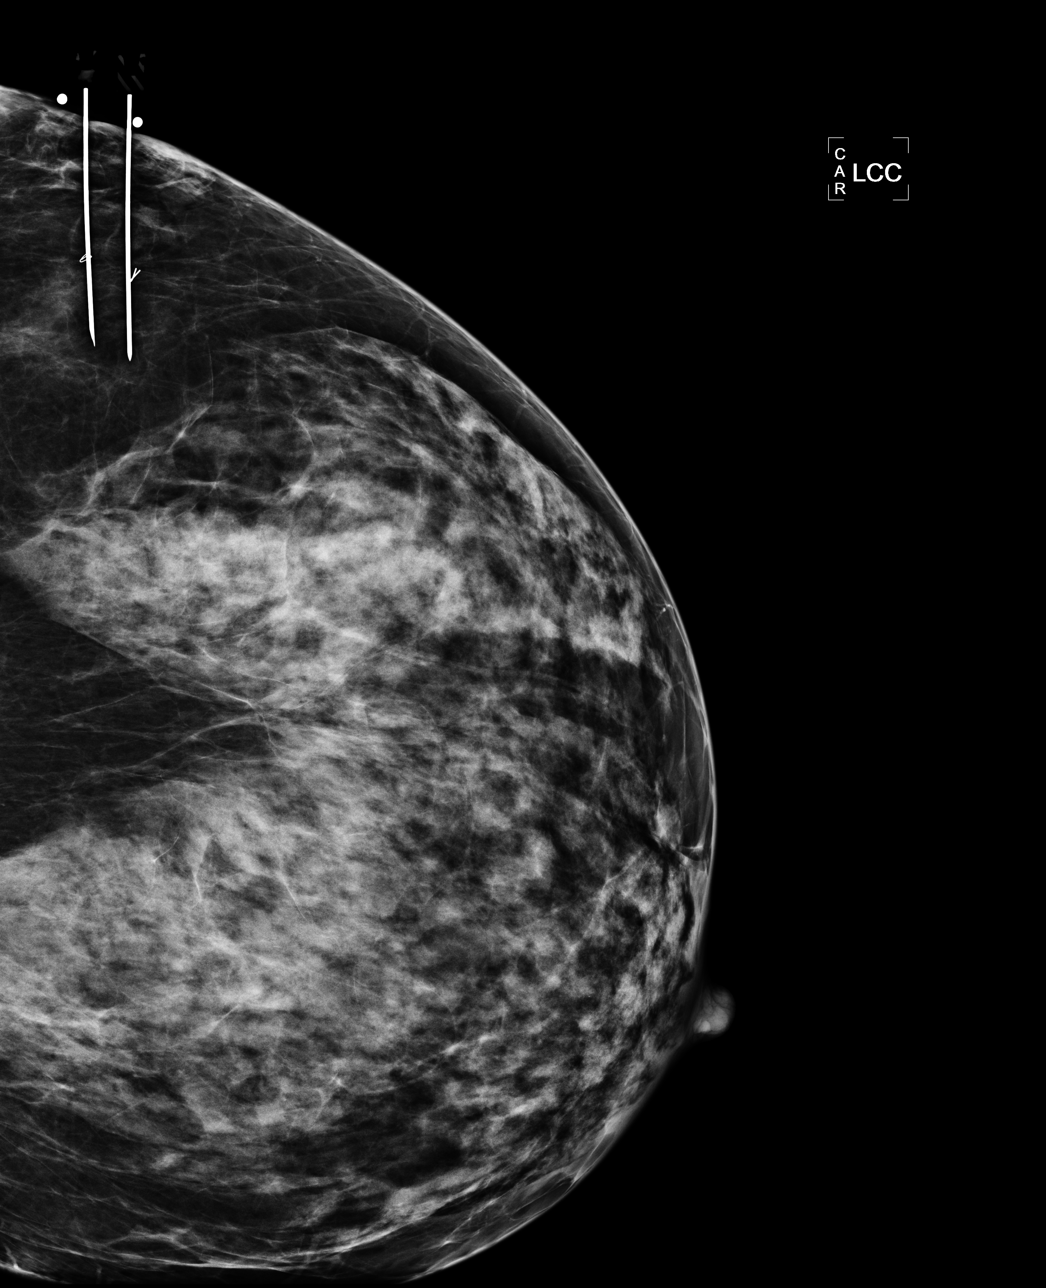

[L LM (1 of 2)]
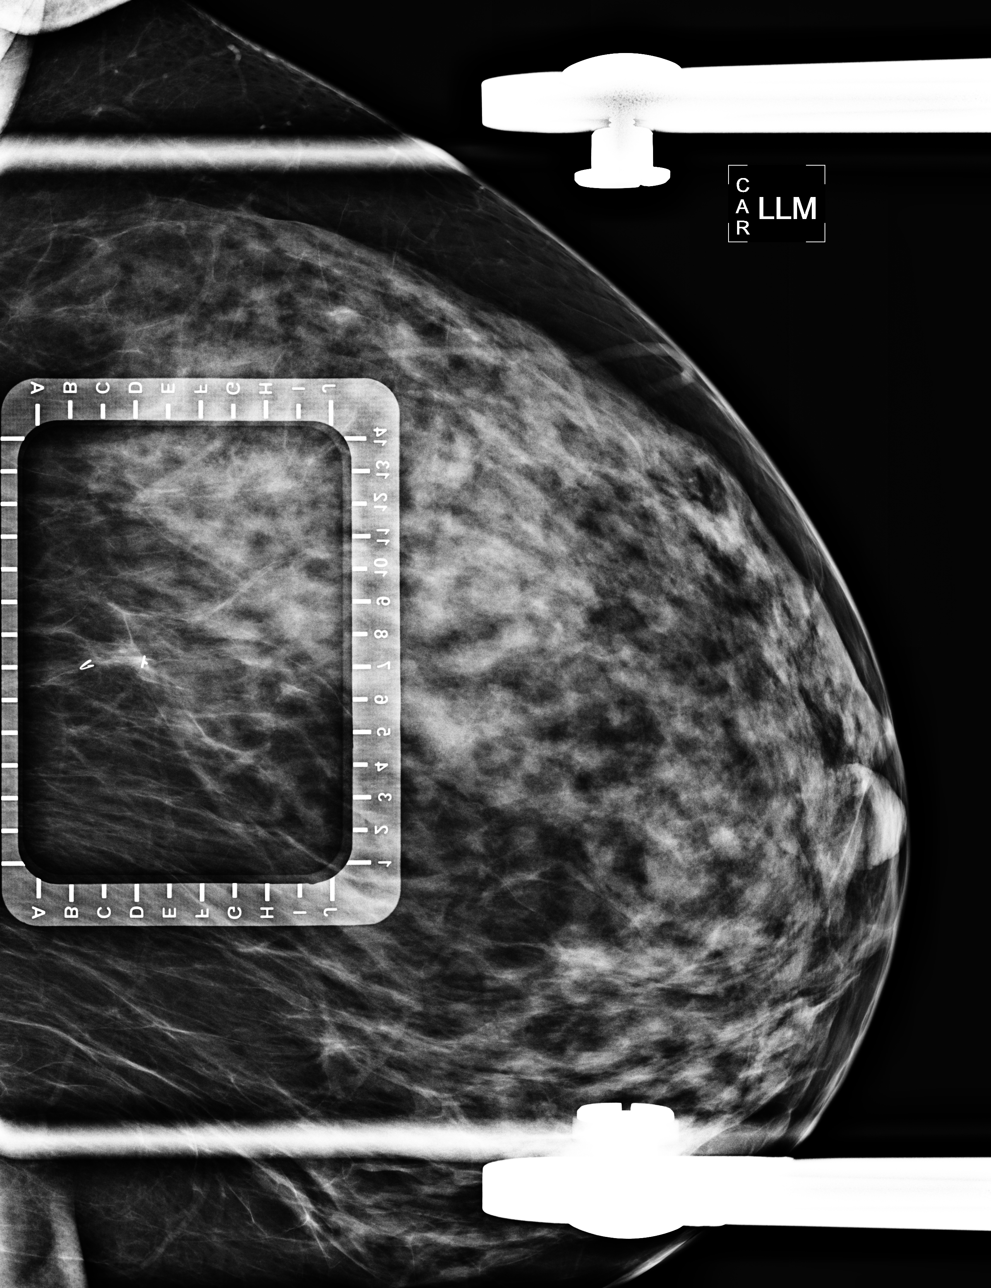

[L CC (2 of 2)]
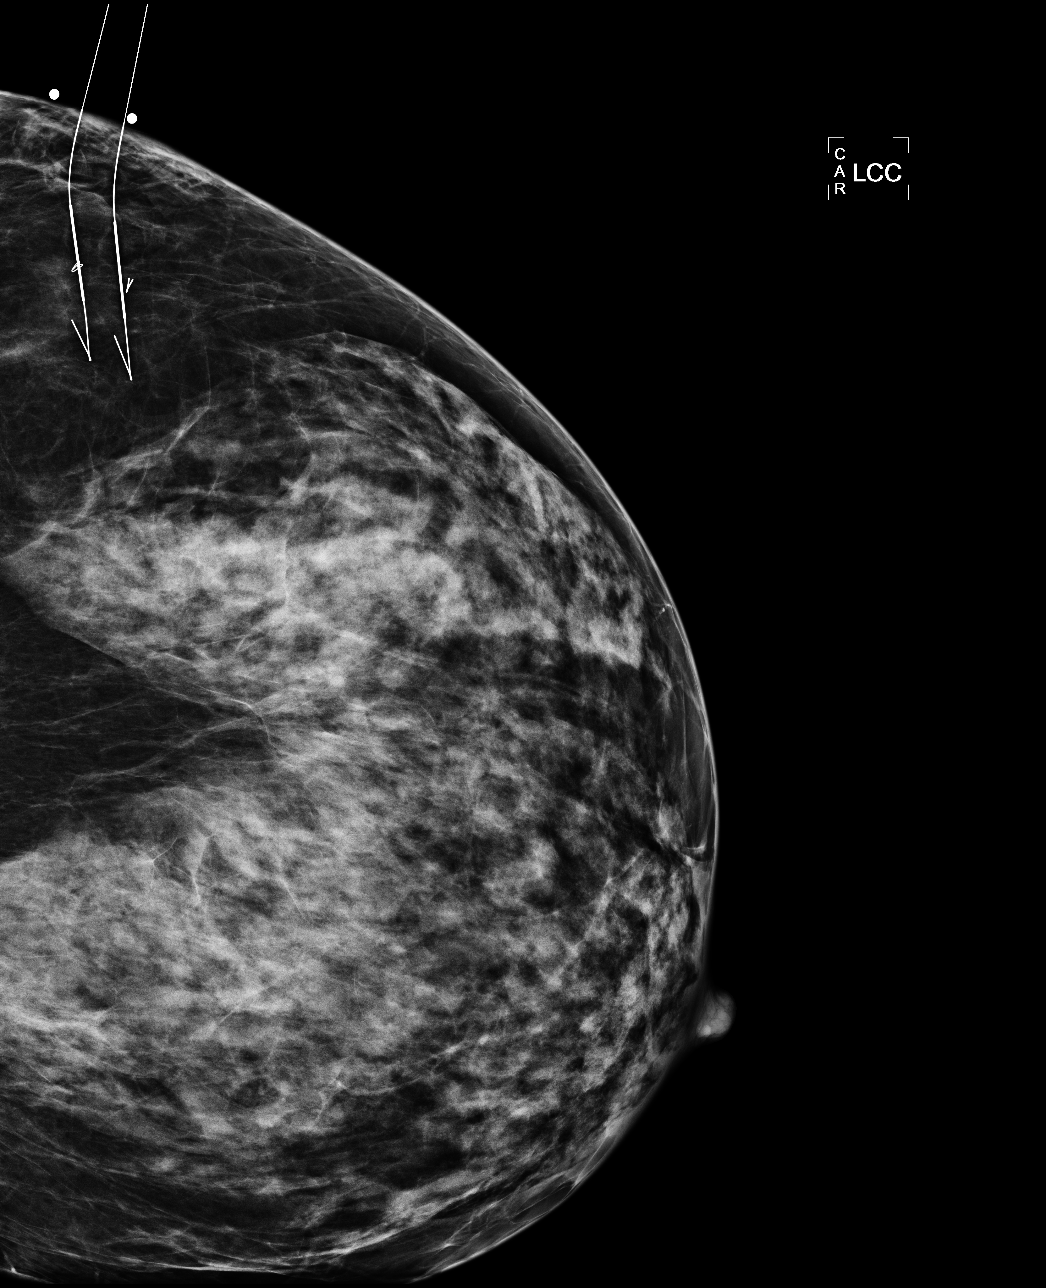

[L LM (2 of 2)]
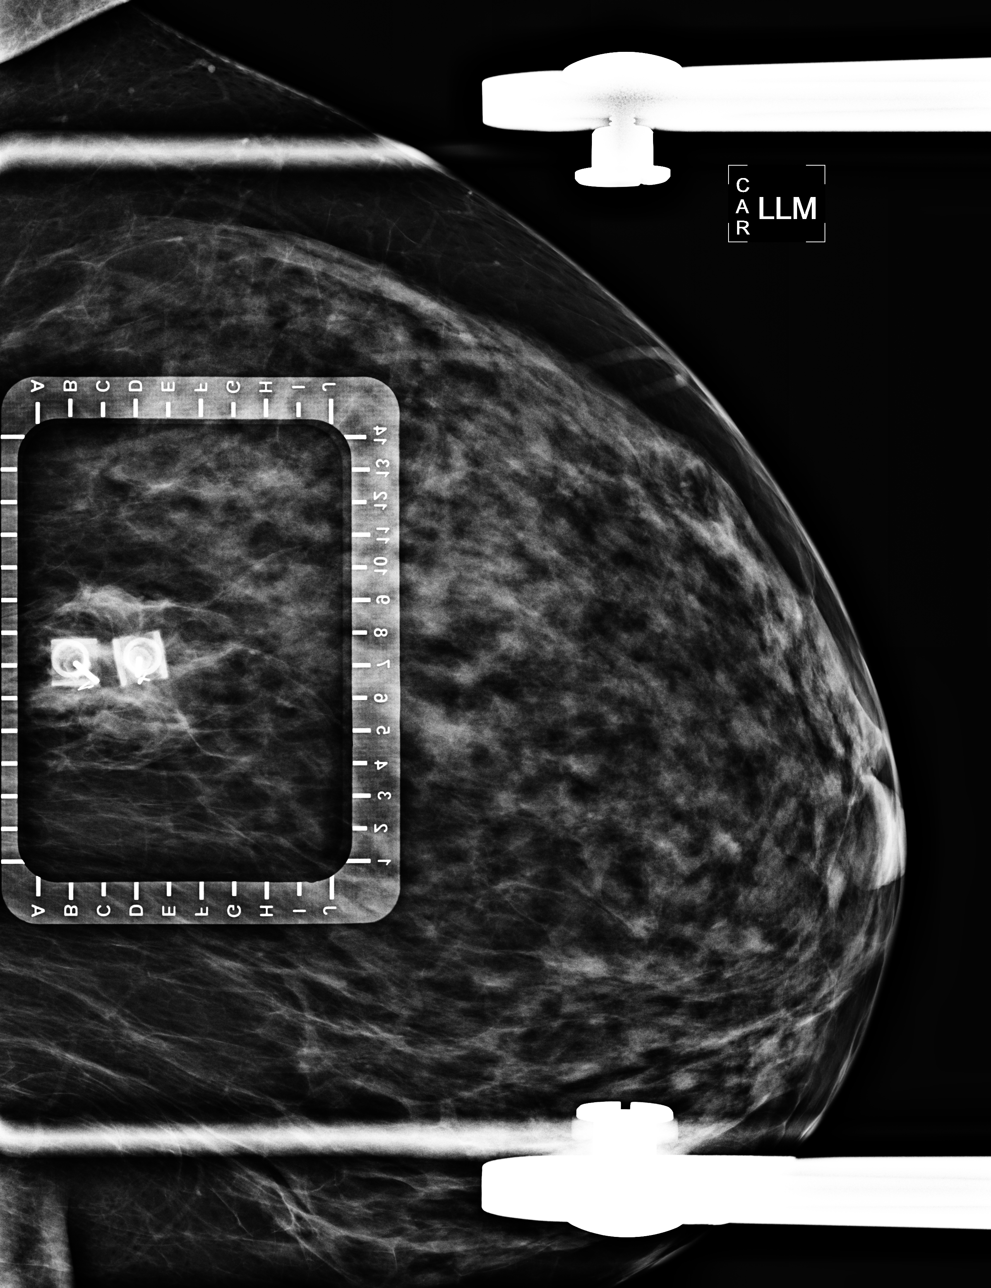

[4 of 4 positions shown; findings below may reference images not displayed]

FINDINGS: Patient presents for needle localization prior to lumpectomy. I met
with the patient and we discussed the procedure of needle
localization including benefits and alternatives. We discussed the
high likelihood of a successful procedure. We discussed the risks of
the procedure, including infection, bleeding, tissue injury, and
further surgery. Informed, written consent was given. The usual
time-out protocol was performed immediately prior to the procedure.

Using mammographic guidance, sterile technique, 2% lidocaine and a 5
cm modified Kopans needle, lesion associated with Tissue marker clip
in the lateral portion of the left breast is localized using lateral
approach. The films were marked for Dr. Erick Uriel. Using the same
technique, the second Tissue marker clip also in the lateral portion
of the left breast was also localized.

Specimen radiograph was performed, confirming both clips and both
wires to be present in the tissue sample. The specimen was marked
for pathology.
IMPRESSION: Needle localization of the left breast. No apparent complications.

## 2015-04-12 ENCOUNTER — Other Ambulatory Visit: Payer: Self-pay | Admitting: Hematology and Oncology

## 2015-04-12 ENCOUNTER — Telehealth: Payer: Self-pay | Admitting: *Deleted

## 2015-04-12 NOTE — Telephone Encounter (Signed)
Called to inquire about whether her neuropathy is resolving since she has been on the Gabapentin x 1 mo. No answer but left a detailed message for pt to call this nurse back @ [phone]. This nurse will be awaiting her call so we can decide whether she needs this med to be refilled or a dose change.

## 2015-04-13 ENCOUNTER — Telehealth: Payer: Self-pay | Admitting: *Deleted

## 2015-04-13 ENCOUNTER — Other Ambulatory Visit: Payer: Self-pay | Admitting: *Deleted

## 2015-04-13 DIAGNOSIS — C50412 Malignant neoplasm of upper-outer quadrant of left female breast: Secondary | ICD-10-CM

## 2015-04-13 MED ORDER — GABAPENTIN 100 MG PO CAPS: 100.0000 mg | ORAL_CAPSULE | Freq: Three times a day (TID) | ORAL | Status: DC

## 2015-04-13 NOTE — Telephone Encounter (Signed)
Patient reports no change in neuropathy after taking neurontin for one month. Discussed with Dr. Lindi Adie, patient advised to continue for 2 more months and then let us know if neuropathy is improved. She verbalized understanding.

## 2015-04-13 NOTE — Telephone Encounter (Signed)
Received an auto refill from pharmacy for Gabapentin for the pt.Called pt x 2 trying to see if her neuropathy has improve since taking the Gabapentin and to see if she needed a refill. No answer but I left two detailed messages for the patient to call this nurse back @ [phone].

## 2015-05-01 IMAGING — CR DG CHEST 1V
1 series · 1 of 1 positions shown · non-contrast
Comparison: DG CHEST 1V PORT dated 01/18/2014

CLINICAL DATA: Recent porta catheter removal.

EXAM:
CHEST - 1 VIEW

[w chest pa]
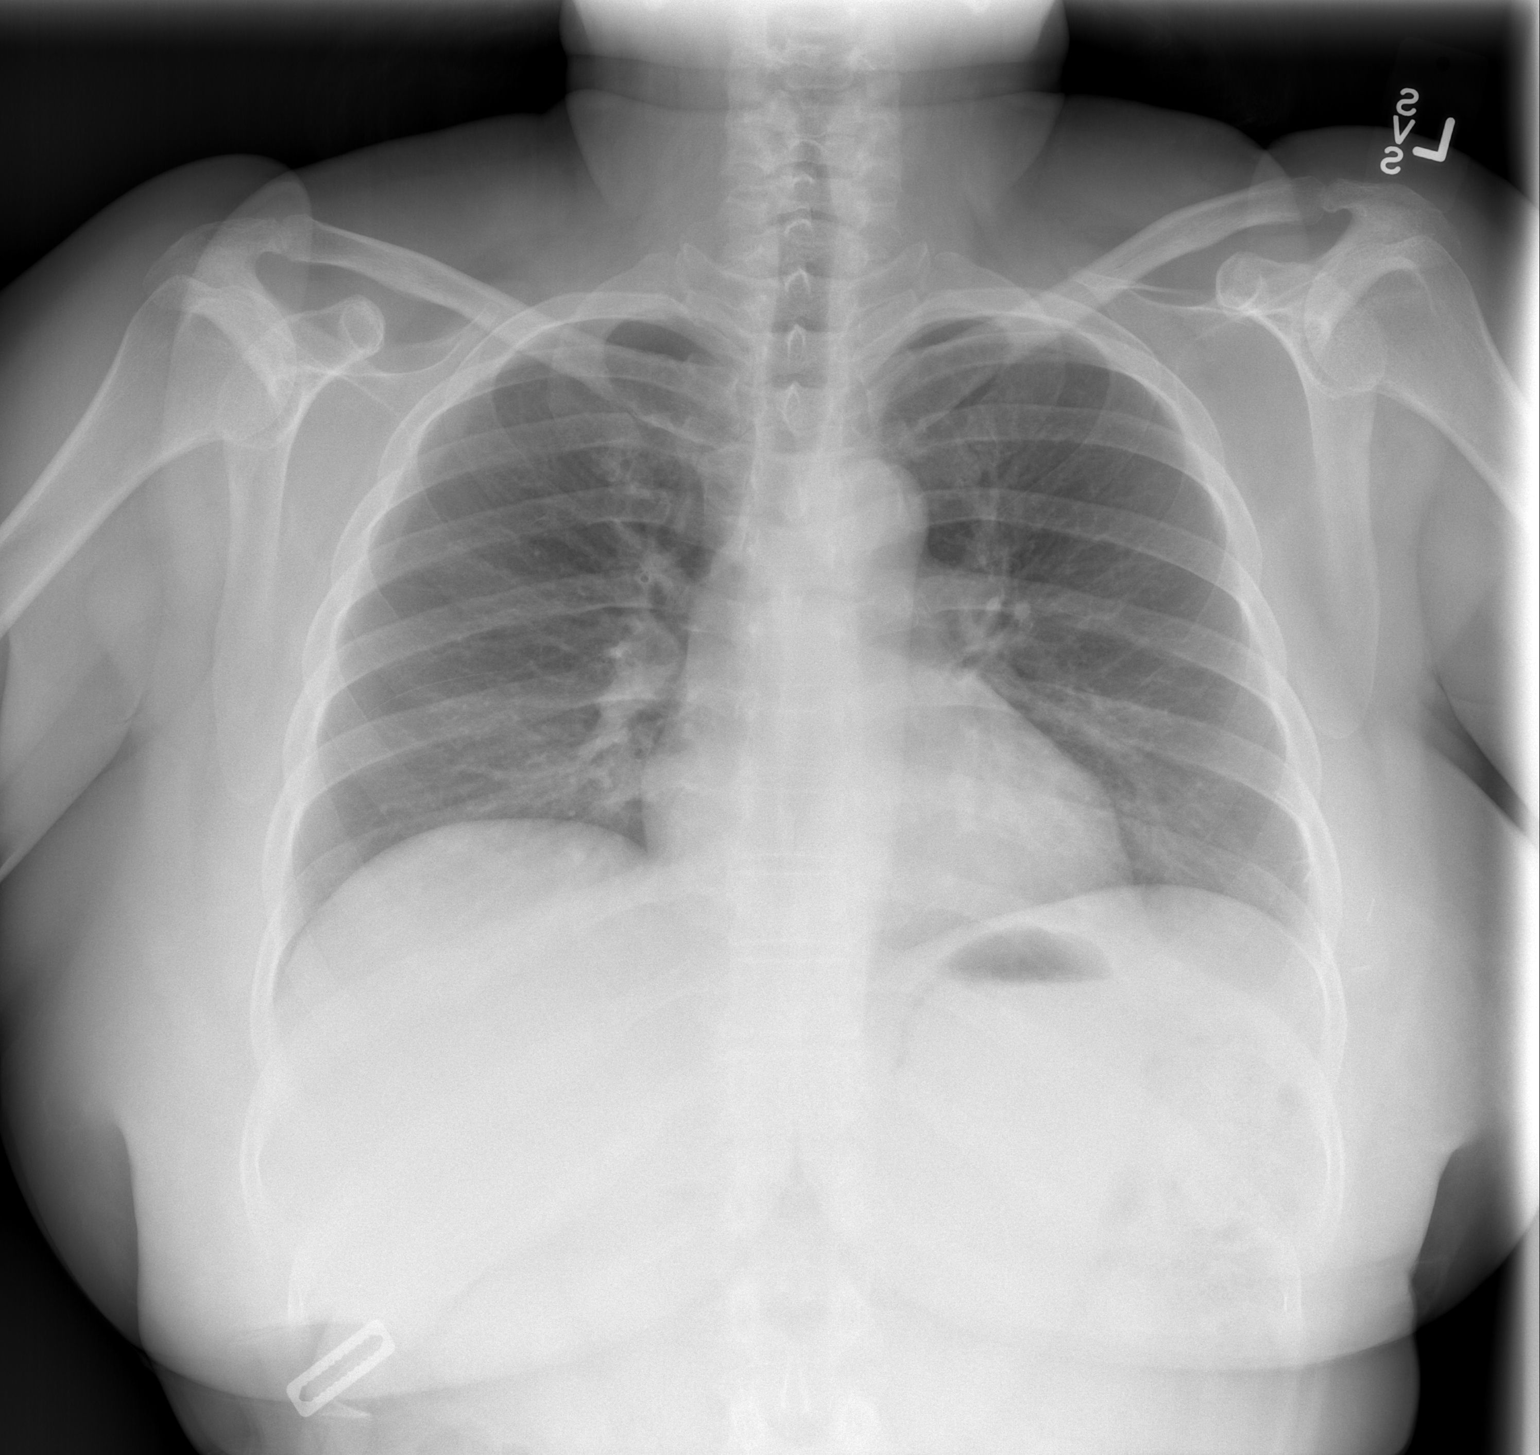

[1 of 1 positions shown; findings below may reference images not displayed]

FINDINGS: Low lung volumes. The heart size and mediastinal contours are within
normal limits. Both lungs are clear. The visualized skeletal
structures are unremarkable. Postsurgical clips left breast.
Otherwise no radiopaque foreign bodies identified. Metallic density
inferior border right breast likely external artifact consistent
with a strap.
IMPRESSION: No active disease.

No evidence of residual porta catheter material.

## 2015-06-06 ENCOUNTER — Other Ambulatory Visit: Payer: Self-pay

## 2015-06-06 ENCOUNTER — Other Ambulatory Visit: Payer: No Typology Code available for payment source

## 2015-06-06 ENCOUNTER — Ambulatory Visit: Payer: No Typology Code available for payment source | Admitting: Hematology and Oncology

## 2015-06-06 NOTE — Assessment & Plan Note (Signed)
stage I (T1 N0) invasive ductal carcinoma of the left breast diagnosed in January 2015. Biopsy initially revealed that ER positive PR positive HER-2/neu positive breast cancer. status post lumpectomy with sentinel lymph node biopsy. The final pathology revealed a 0.4 cm disease that again is triple positive. All lymph nodes were negative for metastatic disease. Completed Adjuvant radiation therapy on 04/07/2014, anastrozole 1 mg daily since July 2015.  Anastrozole toxicities: 1. Mild stiffness in the fingers especially in the morning 2. Tingling of the tips of the fingers which she attributes to antiestrogen therapy  Breast Cancer Surveillance: 1. Breast exam 06/06/2015: Normal 2. Mammogram 09/23/2014 No abnormalities. Postsurgical changes. Breast Density Category C. I recommended that she get 3-D mammograms for surveillance. Discussed the differences between different breast density categories.  Return to clinic in 6 months for follow-up

## 2015-06-07 ENCOUNTER — Telehealth: Payer: Self-pay | Admitting: Hematology and Oncology

## 2015-06-07 NOTE — Telephone Encounter (Signed)
s.w. pt and advised on new appt for JULY....pt ok and aware

## 2015-06-12 NOTE — Assessment & Plan Note (Signed)
1 stage I (T1 N0) invasive ductal carcinoma of the left breast diagnosed in January 2015. Biopsy initially revealed that ER positive PR positive HER-2/neu positive breast cancer. status post lumpectomy with sentinel lymph node biopsy. The final pathology revealed a 0.4 cm disease that again is triple positive. All lymph nodes were negative for metastatic disease. Completed Adjuvant radiation therapy on 04/07/2014,  anastrozole 1 mg daily July 2015   2. monitoring closely for toxicities related to antiestrogen therapy. Patient has mild stiffness in the fingers in the morning along with tingling of the tips of the fingers. She attributes these to being on antiestrogen therapy. I encouraged her to do more exercises in the fingers to alleviate the stiffness.  3. Breast cancer surveillance:  A. mammogram done 09/23/2014 normal  B. breast exam done 11/28/2014 normal   Return to clinic in 6 months for follow-up    

## 2015-06-13 ENCOUNTER — Ambulatory Visit (HOSPITAL_BASED_OUTPATIENT_CLINIC_OR_DEPARTMENT_OTHER): Payer: No Typology Code available for payment source | Admitting: Hematology and Oncology

## 2015-06-13 ENCOUNTER — Encounter: Payer: Self-pay | Admitting: Hematology and Oncology

## 2015-06-13 ENCOUNTER — Other Ambulatory Visit (HOSPITAL_BASED_OUTPATIENT_CLINIC_OR_DEPARTMENT_OTHER): Payer: No Typology Code available for payment source

## 2015-06-13 VITALS — BP 96/46 | HR 97 | Temp 98.0°F | Resp 18 | Ht 63.0 in | Wt 157.5 lb

## 2015-06-13 DIAGNOSIS — G629 Polyneuropathy, unspecified: Secondary | ICD-10-CM

## 2015-06-13 DIAGNOSIS — Z79811 Long term (current) use of aromatase inhibitors: Secondary | ICD-10-CM

## 2015-06-13 DIAGNOSIS — Z170 Estrogen receptor positive status [ER+]: Secondary | ICD-10-CM

## 2015-06-13 DIAGNOSIS — C50412 Malignant neoplasm of upper-outer quadrant of left female breast: Secondary | ICD-10-CM | POA: Diagnosis not present

## 2015-06-13 LAB — CBC WITH DIFFERENTIAL/PLATELET
BASO%: 0.7 % (ref 0.0–2.0)
Basophils Absolute: 0 10*3/uL (ref 0.0–0.1)
EOS%: 3.3 % (ref 0.0–7.0)
Eosinophils Absolute: 0.2 10*3/uL (ref 0.0–0.5)
HCT: 40.5 % (ref 34.8–46.6)
HEMOGLOBIN: 13.7 g/dL (ref 11.6–15.9)
LYMPH#: 1.3 10*3/uL (ref 0.9–3.3)
LYMPH%: 29.3 % (ref 14.0–49.7)
MCH: 31 pg (ref 25.1–34.0)
MCHC: 33.8 g/dL (ref 31.5–36.0)
MCV: 91.7 fL (ref 79.5–101.0)
MONO#: 0.3 10*3/uL (ref 0.1–0.9)
MONO%: 7.7 % (ref 0.0–14.0)
NEUT#: 2.7 10*3/uL (ref 1.5–6.5)
NEUT%: 59 % (ref 38.4–76.8)
Platelets: 182 10*3/uL (ref 145–400)
RBC: 4.42 10*6/uL (ref 3.70–5.45)
RDW: 13.1 % (ref 11.2–14.5)
WBC: 4.5 10*3/uL (ref 3.9–10.3)

## 2015-06-13 LAB — COMPREHENSIVE METABOLIC PANEL (CC13)
ALBUMIN: 4 g/dL (ref 3.5–5.0)
ALK PHOS: 67 U/L (ref 40–150)
ALT: 17 U/L (ref 0–55)
ANION GAP: 6 meq/L (ref 3–11)
AST: 16 U/L (ref 5–34)
BILIRUBIN TOTAL: 0.71 mg/dL (ref 0.20–1.20)
BUN: 10.7 mg/dL (ref 7.0–26.0)
CHLORIDE: 107 meq/L (ref 98–109)
CO2: 29 mEq/L (ref 22–29)
Calcium: 9.2 mg/dL (ref 8.4–10.4)
Creatinine: 0.7 mg/dL (ref 0.6–1.1)
GLUCOSE: 103 mg/dL (ref 70–140)
Potassium: 4 mEq/L (ref 3.5–5.1)
SODIUM: 142 meq/L (ref 136–145)
Total Protein: 6.7 g/dL (ref 6.4–8.3)

## 2015-06-13 NOTE — Progress Notes (Signed)
Patient Care Team: Eartha Inch, MD as PCP - General (Family Medicine)  DIAGNOSIS: Breast cancer of upper-outer quadrant of left female breast   Staging form: Breast, AJCC 7th Edition     Clinical: Stage IA (T1c, N0, cM0) - Unsigned       Staging comments: Staged at breast conference 12/22/13.      Pathologic: No stage assigned - Unsigned   SUMMARY OF ONCOLOGIC HISTORY:   Breast cancer of upper-outer quadrant of left female breast   09/21/2013 Mammogram Left breast mass with calcifications upper outer quadrant, ultrasound 2 distinct masses 8 mm and 4 mm (could be LN)   12/16/2013 Initial Diagnosis Breast cancer of upper-outer quadrant of left female breast: Grade 2 invasive ductal carcinoma ER 100% PR 100% HER-2 positive ratio 3.9 Ki-67 20% and 28%;  T1 A. N0 M0 stage IA   12/21/2013 Breast MRI Left breast 3:00 position: 2 cm area of clumped nodular enhancement   01/18/2014 Surgery Left breast lumpectomy: Invasive ductal carcinoma grade 20.4 cm with intermediate grade DCIS 0/2 SLN   02/21/2014 - 04/07/2014 Radiation Therapy Radiation therapy to lumpectomy site   05/27/2014 -  Anti-estrogen oral therapy Arimidex 1 mg by mouth daily    CHIEF COMPLIANT: Stiffness in the hands and tingling and numbness  INTERVAL HISTORY: Regina Martinez is a 44 year with above-mentioned history of left breast cancer treated with lumpectomy and radiation and is currently on Arimidex therapy. She started complaining of tingling and numbness around March or April of this year. He does continue to bother her. We have prescribed Neurontin but it has not helped her. She attributes most of the symptoms to the right hand worse than the left. Denies any lumps or nodules in the breast.  REVIEW OF SYSTEMS:   Constitutional: Denies fevers, chills or abnormal weight loss Eyes: Denies blurriness of vision Ears, nose, mouth, throat, and face: Denies mucositis or sore throat Respiratory: Denies cough, dyspnea or  wheezes Cardiovascular: Denies palpitation, chest discomfort or lower extremity swelling Gastrointestinal:  Denies nausea, heartburn or change in bowel habits Skin: Denies abnormal skin rashes Lymphatics: Denies new lymphadenopathy or easy bruising Neurological: Stiffness in the hands and tingling and numbness of the tips of the fingers Behavioral/Psych: Mood is stable, no new changes  Breast:  denies any pain or lumps or nodules in either breasts All other systems were reviewed with the patient and are negative.  I have reviewed the past medical history, past surgical history, social history and family history with the patient and they are unchanged from previous note.  ALLERGIES:  has No Known Allergies.  MEDICATIONS:  Current Outpatient Prescriptions  Medication Sig Dispense Refill  . anastrozole (ARIMIDEX) 1 MG tablet Take 1 tablet (1 mg total) by mouth daily. 90 tablet 1  . Calcium Carb-Cholecalciferol (CALCIUM 600 + D PO) Take 1 tablet by mouth daily.    . Cholecalciferol (VITAMIN D-3 PO) Take 2,000 Int'l Units by mouth daily.    Marland Kitchen gabapentin (NEURONTIN) 100 MG capsule Take 1 capsule (100 mg total) by mouth 3 (three) times daily. 180 capsule 0  . hyaluronate sodium (RADIAPLEXRX) GEL Apply 1 application topically 2 (two) times daily.    . non-metallic deodorant Thornton Papas) MISC Apply 1 application topically daily as needed.     No current facility-administered medications for this visit.    PHYSICAL EXAMINATION: ECOG PERFORMANCE STATUS: 1 - Symptomatic but completely ambulatory  Filed Vitals:   06/13/15 0927  BP: 96/46  Pulse: 97  Temp: 98 F (36.7 C)  Resp: 18   Filed Weights   06/13/15 0927  Weight: 157 lb 8 oz (71.442 kg)    GENERAL:alert, no distress and comfortable SKIN: skin color, texture, turgor are normal, no rashes or significant lesions EYES: normal, Conjunctiva are pink and non-injected, sclera clear OROPHARYNX:no exudate, no erythema and lips, buccal mucosa,  and tongue normal  NECK: supple, thyroid normal size, non-tender, without nodularity LYMPH:  no palpable lymphadenopathy in the cervical, axillary or inguinal LUNGS: clear to auscultation and percussion with normal breathing effort HEART: regular rate & rhythm and no murmurs and no lower extremity edema ABDOMEN:abdomen soft, non-tender and normal bowel sounds Musculoskeletal:no cyanosis of digits and no clubbing  NEURO: alert & oriented x 3 with fluent speech, no focal motor/sensory deficits BREAST: No palpable masses or nodules in either right or left breasts. No palpable axillary supraclavicular or infraclavicular adenopathy no breast tenderness or nipple discharge. (exam performed in the presence of a chaperone)  LABORATORY DATA:  I have reviewed the data as listed   Chemistry      Component Value Date/Time   NA 142 08/22/2014 1041   NA 141 02/14/2014 1400   K 3.9 08/22/2014 1041   K 3.9 02/14/2014 1400   CL 102 02/14/2014 1400   CO2 29 08/22/2014 1041   CO2 27 02/14/2014 1400   BUN 7.9 08/22/2014 1041   BUN 9 02/14/2014 1400   CREATININE 0.7 08/22/2014 1041   CREATININE 0.50 02/14/2014 1400      Component Value Date/Time   CALCIUM 9.5 08/22/2014 1041   CALCIUM 9.6 02/14/2014 1400   ALKPHOS 53 08/22/2014 1041   ALKPHOS 55 02/14/2014 1400   AST 18 08/22/2014 1041   AST 19 02/14/2014 1400   ALT 14 08/22/2014 1041   ALT 16 02/14/2014 1400   BILITOT 0.56 08/22/2014 1041   BILITOT 0.5 02/14/2014 1400       Lab Results  Component Value Date   WBC 4.5 06/13/2015   HGB 13.7 06/13/2015   HCT 40.5 06/13/2015   MCV 91.7 06/13/2015   PLT 182 06/13/2015   NEUTROABS 2.7 06/13/2015   ASSESSMENT & PLAN:  Breast cancer of upper-outer quadrant of left female breast 1 stage I (T1 N0) invasive ductal carcinoma of the left breast diagnosed in January 2015. Biopsy initially revealed that ER positive PR positive HER-2/neu positive breast cancer. status post lumpectomy with sentinel  lymph node biopsy. The final pathology revealed a 0.4 cm disease that again is triple positive. All lymph nodes were negative for metastatic disease. Completed Adjuvant radiation therapy on 04/07/2014, anastrozole 1 mg daily July 2015   2. monitoring closely for toxicities related to antiestrogen therapy.  Patient has mild stiffness in the fingers in the morning along with tingling of the tips of the fingers. I discussed with her that the stiffness is related to anastrozole therapy. I encouraged her to do more exercises in the fingers to alleviate the stiffness. Neuropathy: Neurontin was prescribed 100 mg 3 times a day but it has not helped her. I discussed with her that she could stop taking it. I encouraged her to talk to her family physician about her neuropathy issues.   3. Breast cancer surveillance:  A. mammogram done 09/23/2014 normal  B. breast exam done 06/13/2015 normal   Return to clinic in 6 months for follow-up    No orders of the defined types were placed in this encounter.   The patient has a good understanding of  the overall plan. she agrees with it. she will call with any problems that may develop before the next visit here.   Rulon Eisenmenger, MD

## 2015-06-14 ENCOUNTER — Telehealth: Payer: Self-pay | Admitting: Hematology and Oncology

## 2015-06-14 NOTE — Telephone Encounter (Signed)
Confirmed appointment for January

## 2015-06-17 ENCOUNTER — Other Ambulatory Visit: Payer: Self-pay | Admitting: Hematology and Oncology

## 2015-06-21 ENCOUNTER — Telehealth: Payer: Self-pay

## 2015-06-21 NOTE — Telephone Encounter (Signed)
LMOVM - pt to call clinic.  Rn trying to check if gabapentin is helping with her neuropathy.

## 2015-08-16 ENCOUNTER — Ambulatory Visit
Admission: RE | Admit: 2015-08-16 | Discharge: 2015-08-16 | Disposition: A | Discharge status: Home / Self Care | Payer: No Typology Code available for payment source | Source: Ambulatory Visit | Attending: Infectious Disease | Admitting: Infectious Disease

## 2015-08-16 ENCOUNTER — Other Ambulatory Visit: Payer: Self-pay | Admitting: Infectious Disease

## 2015-08-16 DIAGNOSIS — R7611 Nonspecific reaction to tuberculin skin test without active tuberculosis: Secondary | ICD-10-CM

## 2015-09-19 ENCOUNTER — Other Ambulatory Visit: Payer: Self-pay | Admitting: Hematology and Oncology

## 2015-09-20 ENCOUNTER — Other Ambulatory Visit: Payer: Self-pay | Admitting: *Deleted

## 2015-09-20 DIAGNOSIS — C50412 Malignant neoplasm of upper-outer quadrant of left female breast: Secondary | ICD-10-CM

## 2015-09-20 MED ORDER — ANASTROZOLE 1 MG PO TABS: 1.0000 mg | ORAL_TABLET | Freq: Every day | ORAL | Status: DC

## 2015-11-22 ENCOUNTER — Other Ambulatory Visit: Payer: Self-pay | Admitting: Hematology and Oncology

## 2015-11-22 DIAGNOSIS — Z853 Personal history of malignant neoplasm of breast: Secondary | ICD-10-CM

## 2015-11-23 ENCOUNTER — Other Ambulatory Visit: Payer: Self-pay

## 2015-11-23 ENCOUNTER — Other Ambulatory Visit: Payer: Self-pay | Admitting: Hematology and Oncology

## 2015-11-23 DIAGNOSIS — Z853 Personal history of malignant neoplasm of breast: Secondary | ICD-10-CM

## 2015-11-28 ENCOUNTER — Inpatient Hospital Stay: Admission: RE | Admit: 2015-11-28 | Payer: Self-pay | Source: Ambulatory Visit

## 2015-12-12 NOTE — Assessment & Plan Note (Signed)
1 stage I (T1 N0) invasive ductal carcinoma of the left breast diagnosed in January 2015. Biopsy initially revealed that ER positive PR positive HER-2/neu positive breast cancer. status post lumpectomy with sentinel lymph node biopsy. The final pathology revealed a 0.4 cm disease that again is triple positive. All lymph nodes were negative for metastatic disease. Completed Adjuvant radiation therapy on 04/07/2014, anastrozole 1 mg daily July 2015   2. Anastrozole toxicities: Patient has mild stiffness in the fingers in the morning along with tingling of the tips of the fingers.  3. Breast cancer surveillance:  A. mammogram  B. breast exam done 12/13/2015 normal   Return to clinic in 6 months for follow-up

## 2015-12-13 ENCOUNTER — Ambulatory Visit: Payer: No Typology Code available for payment source | Admitting: Hematology and Oncology

## 2015-12-13 ENCOUNTER — Other Ambulatory Visit: Payer: No Typology Code available for payment source

## 2016-01-01 ENCOUNTER — Inpatient Hospital Stay: Admission: RE | Admit: 2016-01-01 | Payer: Self-pay | Source: Ambulatory Visit

## 2016-01-24 ENCOUNTER — Encounter: Payer: Self-pay | Admitting: *Deleted

## 2016-01-24 NOTE — Progress Notes (Signed)
mammo report received from [facility], reviewed by Dr. Gudena, sent to scan. 

## 2016-09-19 ENCOUNTER — Other Ambulatory Visit: Payer: Self-pay | Admitting: Hematology and Oncology

## 2016-09-19 DIAGNOSIS — C50412 Malignant neoplasm of upper-outer quadrant of left female breast: Secondary | ICD-10-CM

## 2016-09-30 ENCOUNTER — Telehealth: Payer: Self-pay | Admitting: *Deleted

## 2016-09-30 NOTE — Telephone Encounter (Signed)
Call from pt requesting to move 12/8 lab/office visit to January. Message to schedulers to contact pt.

## 2016-11-01 ENCOUNTER — Other Ambulatory Visit: Payer: Self-pay

## 2016-11-01 ENCOUNTER — Ambulatory Visit: Payer: Self-pay | Admitting: Hematology and Oncology

## 2016-11-29 ENCOUNTER — Telehealth: Payer: Self-pay | Admitting: Hematology and Oncology

## 2016-11-29 NOTE — Telephone Encounter (Signed)
PT CALLED TO R/S 1/11 APPT TO 1/22 AT 230 PM. PT HAS NEW APPT DATE/TIME

## 2016-12-05 ENCOUNTER — Ambulatory Visit: Payer: Self-pay | Admitting: Hematology and Oncology

## 2016-12-05 ENCOUNTER — Other Ambulatory Visit: Payer: Self-pay

## 2016-12-13 ENCOUNTER — Other Ambulatory Visit: Payer: Self-pay

## 2016-12-13 DIAGNOSIS — C50412 Malignant neoplasm of upper-outer quadrant of left female breast: Secondary | ICD-10-CM

## 2016-12-15 NOTE — Assessment & Plan Note (Signed)
stage I (T1 N0) invasive ductal carcinoma of the left breast diagnosed in January 2015. Biopsy initially revealed that ER positive PR positive HER-2/neu positive breast cancer. status post lumpectomy with sentinel lymph node biopsy. The final pathology revealed a 0.4 cm disease that again is triple positive. All lymph nodes were negative for metastatic disease. Completed Adjuvant radiation therapy on 04/07/2014, anastrozole 1 mg daily July 2015   2. monitoring closely for toxicities related to antiestrogen therapy.  Patient has mild stiffness in the fingers in the morning along with tingling of the tips of the fingers. I discussed with her that the stiffness is related to anastrozole therapy. I encouraged her to do more exercises in the fingers to alleviate the stiffness. Neuropathy: Neurontin was prescribed 100 mg 3 times a day but it has not helped her. I discussed with her that she could stop taking it. I encouraged her to talk to her family physician about her neuropathy issues.   3. Breast cancer surveillance:  A. mammogram done 09/23/2014 normal  B. breast exam done 12/16/16 normal   Return to clinic in 1 year for follow-up

## 2016-12-16 ENCOUNTER — Encounter: Payer: Self-pay | Admitting: Hematology and Oncology

## 2016-12-16 ENCOUNTER — Other Ambulatory Visit (HOSPITAL_BASED_OUTPATIENT_CLINIC_OR_DEPARTMENT_OTHER): Payer: BLUE CROSS/BLUE SHIELD

## 2016-12-16 ENCOUNTER — Ambulatory Visit (HOSPITAL_BASED_OUTPATIENT_CLINIC_OR_DEPARTMENT_OTHER): Payer: BLUE CROSS/BLUE SHIELD | Admitting: Hematology and Oncology

## 2016-12-16 ENCOUNTER — Other Ambulatory Visit: Payer: Self-pay | Admitting: Hematology and Oncology

## 2016-12-16 DIAGNOSIS — C50412 Malignant neoplasm of upper-outer quadrant of left female breast: Secondary | ICD-10-CM | POA: Diagnosis not present

## 2016-12-16 DIAGNOSIS — Z79811 Long term (current) use of aromatase inhibitors: Secondary | ICD-10-CM | POA: Diagnosis not present

## 2016-12-16 DIAGNOSIS — Z170 Estrogen receptor positive status [ER+]: Secondary | ICD-10-CM | POA: Diagnosis not present

## 2016-12-16 LAB — CBC WITH DIFFERENTIAL/PLATELET
BASO%: 0.6 % (ref 0.0–2.0)
BASOS ABS: 0 10*3/uL (ref 0.0–0.1)
EOS ABS: 0.2 10*3/uL (ref 0.0–0.5)
EOS%: 3.1 % (ref 0.0–7.0)
HCT: 41.5 % (ref 34.8–46.6)
HEMOGLOBIN: 13.9 g/dL (ref 11.6–15.9)
LYMPH#: 1.9 10*3/uL (ref 0.9–3.3)
LYMPH%: 30.2 % (ref 14.0–49.7)
MCH: 31 pg (ref 25.1–34.0)
MCHC: 33.4 g/dL (ref 31.5–36.0)
MCV: 92.6 fL (ref 79.5–101.0)
MONO#: 0.4 10*3/uL (ref 0.1–0.9)
MONO%: 7.2 % (ref 0.0–14.0)
NEUT#: 3.6 10*3/uL (ref 1.5–6.5)
NEUT%: 58.9 % (ref 38.4–76.8)
PLATELETS: 197 10*3/uL (ref 145–400)
RBC: 4.48 10*6/uL (ref 3.70–5.45)
RDW: 12.9 % (ref 11.2–14.5)
WBC: 6.2 10*3/uL (ref 3.9–10.3)

## 2016-12-16 LAB — COMPREHENSIVE METABOLIC PANEL
ALBUMIN: 4.3 g/dL (ref 3.5–5.0)
ALK PHOS: 75 U/L (ref 40–150)
ALT: 15 U/L (ref 0–55)
ANION GAP: 11 meq/L (ref 3–11)
AST: 18 U/L (ref 5–34)
BUN: 10.2 mg/dL (ref 7.0–26.0)
CALCIUM: 9.8 mg/dL (ref 8.4–10.4)
CHLORIDE: 104 meq/L (ref 98–109)
CO2: 25 mEq/L (ref 22–29)
CREATININE: 0.7 mg/dL (ref 0.6–1.1)
EGFR: >90 mL/min/{1.73_m2} (ref >90)
Glucose: 90 mg/dl (ref 70–140)
POTASSIUM: 4.1 meq/L (ref 3.5–5.1)
Sodium: 141 mEq/L (ref 136–145)
Total Bilirubin: 0.94 mg/dL (ref 0.20–1.20)
Total Protein: 7.2 g/dL (ref 6.4–8.3)

## 2016-12-16 NOTE — Progress Notes (Signed)
Patient Care Team: [name], MD as PCP - General (Family Medicine)  DIAGNOSIS:  Encounter Diagnosis  Name Primary?  . Malignant neoplasm of upper-outer quadrant of left breast in female, estrogen receptor positive ([place])     SUMMARY OF ONCOLOGIC HISTORY:   Breast cancer of upper-outer quadrant of left female breast ([place])   09/21/2013 Mammogram    Left breast mass with calcifications upper outer quadrant, ultrasound 2 distinct masses 8 mm and 4 mm (could be LN)      12/16/2013 Initial Diagnosis    Breast cancer of upper-outer quadrant of left female breast: Grade 2 invasive ductal carcinoma ER 100% PR 100% HER-2 positive ratio 3.9 Ki-67 20% and 28%;  T1 A. N0 M0 stage IA      12/21/2013 Breast MRI    Left breast 3:00 position: 2 cm area of clumped nodular enhancement      01/18/2014 Surgery    Left breast lumpectomy: Invasive ductal carcinoma grade 20.4 cm with intermediate grade DCIS 0/2 SLN      02/21/2014 - 04/07/2014 Radiation Therapy    Radiation therapy to lumpectomy site      05/27/2014 -  Anti-estrogen oral therapy    Arimidex 1 mg by mouth daily       CHIEF COMPLIANT: Follow-up on Arimidex  INTERVAL HISTORY: Regina Martinez is a 56 year old with above-mentioned history of left breast cancer treated with lumpectomy and radiation is currently on Arimidex therapy since July 2015. She is tolerating Arimidex very well. She had pain in her hands from anastrozole and it appears that the symptoms have resolved. She does not have any hot flashes. She denies any lumps or nodules in the breast. She does not remember when she had her last mammogram.  REVIEW OF SYSTEMS:   Constitutional: Denies fevers, chills or abnormal weight loss Eyes: Denies blurriness of vision Ears, nose, mouth, throat, and face: Denies mucositis or sore throat Respiratory: Denies cough, dyspnea or wheezes Cardiovascular: Denies palpitation, chest discomfort Gastrointestinal:  Denies nausea,  heartburn or change in bowel habits Skin: Denies abnormal skin rashes Lymphatics: Denies new lymphadenopathy or easy bruising Neurological:Denies numbness, tingling or new weaknesses Behavioral/Psych: Mood is stable, no new changes  Extremities: No lower extremity edema Breast:  denies any pain or lumps or nodules in either breasts All other systems were reviewed with the patient and are negative.  I have reviewed the past medical history, past surgical history, social history and family history with the patient and they are unchanged from previous note.  ALLERGIES:  has No Known Allergies.  MEDICATIONS:  Current Outpatient Prescriptions  Medication Sig Dispense Refill  . anastrozole (ARIMIDEX) 1 MG tablet TAKE 1 TABLET (1 MG TOTAL) BY MOUTH DAILY. 90 tablet 0  . Calcium Carb-Cholecalciferol (CALCIUM 600 + D PO) Take 1 tablet by mouth daily.    . Cholecalciferol (VITAMIN D-3 PO) Take 2,000 Int'l Units by mouth daily.    Marland Kitchen gabapentin (NEURONTIN) 100 MG capsule TAKE ONE CAPSULE BY MOUTH 3 TIMES A DAY 180 capsule 2  . hyaluronate sodium (RADIAPLEXRX) GEL Apply 1 application topically 2 (two) times daily.    . non-metallic deodorant Jethro Poling) MISC Apply 1 application topically daily as needed.     No current facility-administered medications for this visit.     PHYSICAL EXAMINATION: ECOG PERFORMANCE STATUS: 0 - Asymptomatic  Vitals:   12/16/16 1507  BP: (!) 99/57  Pulse: 61  Resp: 18  Temp: 98.1 F (36.7 C)   Filed Weights  12/16/16 1507  Weight: 157 lb 4.8 oz (71.4 kg)    GENERAL:alert, no distress and comfortable SKIN: skin color, texture, turgor are normal, no rashes or significant lesions EYES: normal, Conjunctiva are pink and non-injected, sclera clear OROPHARYNX:no exudate, no erythema and lips, buccal mucosa, and tongue normal  NECK: supple, thyroid normal size, non-tender, without nodularity LYMPH:  no palpable lymphadenopathy in the cervical, axillary or  inguinal LUNGS: clear to auscultation and percussion with normal breathing effort HEART: regular rate & rhythm and no murmurs and no lower extremity edema ABDOMEN:abdomen soft, non-tender and normal bowel sounds MUSCULOSKELETAL:no cyanosis of digits and no clubbing  NEURO: alert & oriented x 3 with fluent speech, no focal motor/sensory deficits EXTREMITIES: No lower extremity edema BREAST: No palpable masses or nodules in either right or left breasts. No palpable axillary supraclavicular or infraclavicular adenopathy no breast tenderness or nipple discharge. (exam performed in the presence of a chaperone)  LABORATORY DATA:  I have reviewed the data as listed   Chemistry      Component Value Date/Time   NA 141 12/16/2016 1459   K 4.1 12/16/2016 1459   CL 102 02/14/2014 1400   CO2 25 12/16/2016 1459   BUN 10.2 12/16/2016 1459   CREATININE 0.7 12/16/2016 1459      Component Value Date/Time   CALCIUM 9.8 12/16/2016 1459   ALKPHOS 75 12/16/2016 1459   AST 18 12/16/2016 1459   ALT 15 12/16/2016 1459   BILITOT 0.94 12/16/2016 1459       Lab Results  Component Value Date   WBC 6.2 12/16/2016   HGB 13.9 12/16/2016   HCT 41.5 12/16/2016   MCV 92.6 12/16/2016   PLT 197 12/16/2016   NEUTROABS 3.6 12/16/2016    ASSESSMENT & PLAN:  Breast cancer of upper-outer quadrant of left female breast stage I (T1 N0) invasive ductal carcinoma of the left breast diagnosed in January 2015. Biopsy initially revealed that ER positive PR positive HER-2/neu positive breast cancer. status post lumpectomy with sentinel lymph node biopsy. The final pathology revealed a 0.4 cm disease that again is triple positive. All lymph nodes were negative for metastatic disease. Completed Adjuvant radiation therapy on 04/07/2014, anastrozole 1 mg daily July 2015   Anastrozole toxicities.  Patient has mild stiffness in the fingers in the morning along with tingling of the tips of the fingers.I encouraged her to do  more exercises in the fingers to alleviate the stiffness.  Patient has not been doing any exercises. I strongly emphasized the need to do exercise at least for 15 minutes every day.  Neuropathy: Off Neurontin  3. Breast cancer surveillance:  A. mammogram done 09/23/2014 normal. Patient does be that she gets mammograms at [facility]. I will request a copy of mammograms from 2016. I still think she did not do mammograms were 2017. I asked her to call [facility] and schedule a mammogram.  B. breast exam done 12/16/16 normal   Return to clinic in 1 year for follow-up    I spent 15 minutes talking to the patient of which more than half was spent in counseling and coordination of care.  No orders of the defined types were placed in this encounter.  The patient has a good understanding of the overall plan. she agrees with it. she will call with any problems that may develop before the next visit here.   Rulon Eisenmenger, MD 12/16/16

## 2016-12-28 ENCOUNTER — Other Ambulatory Visit: Payer: Self-pay | Admitting: Hematology and Oncology

## 2016-12-28 DIAGNOSIS — C50412 Malignant neoplasm of upper-outer quadrant of left female breast: Secondary | ICD-10-CM

## 2016-12-31 ENCOUNTER — Other Ambulatory Visit: Payer: Self-pay | Admitting: Emergency Medicine

## 2016-12-31 DIAGNOSIS — C50412 Malignant neoplasm of upper-outer quadrant of left female breast: Secondary | ICD-10-CM

## 2016-12-31 MED ORDER — ANASTROZOLE 1 MG PO TABS: 1.0000 mg | ORAL_TABLET | Freq: Every day | ORAL | 3 refills | Status: AC

## 2017-01-15 NOTE — Progress Notes (Signed)
Received [facility] mm. Sent to scan. 

## 2017-12-15 NOTE — Assessment & Plan Note (Deleted)
stage I (T1 N0) invasive ductal carcinoma of the left breast diagnosed in January 2015. Biopsy initially revealed that ER positive PR positive HER-2/neu positive breast cancer. status post lumpectomy with sentinel lymph node biopsy. The final pathology revealed a 0.4 cm disease that again is triple positive. All lymph nodes were negative for metastatic disease. Completed Adjuvant radiation therapy on 04/07/2014, anastrozole 1 mg daily July 2015   2. monitoring closely for toxicities related to antiestrogen therapy.  Patient has mild stiffness in the fingers in the morning along with tingling of the tips of the fingers.  Neuropathy: Neurontin was prescribed 100 mg 3 times a day but it has not helped her. I discussed with her that she could stop taking it. I encouraged her to talk to her family physician about her neuropathy issues.   3. Breast cancer surveillance:  A. mammogram done 09/23/2014 normal  B. breast exam done 12/16/17 normal   Return to clinic in 1 year for follow-up

## 2017-12-16 ENCOUNTER — Ambulatory Visit: Payer: BLUE CROSS/BLUE SHIELD | Admitting: Hematology and Oncology

## 2017-12-16 NOTE — Progress Notes (Deleted)
Patient Care Team: [name], MD as PCP - General (Family Medicine)  DIAGNOSIS:  Encounter Diagnosis  Name Primary?  . Malignant neoplasm of upper-outer quadrant of left breast in female, estrogen receptor positive ([place])     SUMMARY OF ONCOLOGIC HISTORY:   Breast cancer of upper-outer quadrant of left female breast ([place])   09/21/2013 Mammogram    Left breast mass with calcifications upper outer quadrant, ultrasound 2 distinct masses 8 mm and 4 mm (could be LN)      12/16/2013 Initial Diagnosis    Breast cancer of upper-outer quadrant of left female breast: Grade 2 invasive ductal carcinoma ER 100% PR 100% HER-2 positive ratio 3.9 Ki-67 20% and 28%;  T1 A. N0 M0 stage IA      12/21/2013 Breast MRI    Left breast 3:00 position: 2 cm area of clumped nodular enhancement      01/18/2014 Surgery    Left breast lumpectomy: Invasive ductal carcinoma grade 20.4 cm with intermediate grade DCIS 0/2 SLN      02/21/2014 - 04/07/2014 Radiation Therapy    Radiation therapy to lumpectomy site      05/27/2014 -  Anti-estrogen oral therapy    Arimidex 1 mg by mouth daily       CHIEF COMPLIANT: Follow-up on Arimidex therapy  INTERVAL HISTORY: Regina Martinez is a 57 year old with above-mentioned history of left breast cancer treated with lumpectomy followed by adjuvant radiation and is currently on anastrozole since July 2015.  She appears to be tolerating anastrozole extremely well.  She denies any hot flashes or myalgias but denies any lumps or nodules in the breast.  REVIEW OF SYSTEMS:   Constitutional: Denies fevers, chills or abnormal weight loss Eyes: Denies blurriness of vision Ears, nose, mouth, throat, and face: Denies mucositis or sore throat Respiratory: Denies cough, dyspnea or wheezes Cardiovascular: Denies palpitation, chest discomfort Gastrointestinal:  Denies nausea, heartburn or change in bowel habits Skin: Denies abnormal skin rashes Lymphatics: Denies new  lymphadenopathy or easy bruising Neurological:Denies numbness, tingling or new weaknesses Behavioral/Psych: Mood is stable, no new changes  Extremities: No lower extremity edema Breast:  denies any pain or lumps or nodules in either breasts All other systems were reviewed with the patient and are negative.  I have reviewed the past medical history, past surgical history, social history and family history with the patient and they are unchanged from previous note.  ALLERGIES:  has No Known Allergies.  MEDICATIONS:  Current Outpatient Medications  Medication Sig Dispense Refill  . anastrozole (ARIMIDEX) 1 MG tablet Take 1 tablet (1 mg total) by mouth daily. 90 tablet 3  . Calcium Carb-Cholecalciferol (CALCIUM 600 + D PO) Take 1 tablet by mouth daily.    . Cholecalciferol (VITAMIN D-3 PO) Take 2,000 Int'l Units by mouth daily.    Marland Kitchen gabapentin (NEURONTIN) 100 MG capsule TAKE ONE CAPSULE BY MOUTH 3 TIMES A DAY 180 capsule 2  . hyaluronate sodium (RADIAPLEXRX) GEL Apply 1 application topically 2 (two) times daily.    . non-metallic deodorant Jethro Poling) MISC Apply 1 application topically daily as needed.     No current facility-administered medications for this visit.     PHYSICAL EXAMINATION: ECOG PERFORMANCE STATUS: 1 - Symptomatic but completely ambulatory  There were no vitals filed for this visit. There were no vitals filed for this visit.  GENERAL:alert, no distress and comfortable SKIN: skin color, texture, turgor are normal, no rashes or significant lesions EYES: normal, Conjunctiva are pink and non-injected,  sclera clear OROPHARYNX:no exudate, no erythema and lips, buccal mucosa, and tongue normal  NECK: supple, thyroid normal size, non-tender, without nodularity LYMPH:  no palpable lymphadenopathy in the cervical, axillary or inguinal LUNGS: clear to auscultation and percussion with normal breathing effort HEART: regular rate & rhythm and no murmurs and no lower extremity  edema ABDOMEN:abdomen soft, non-tender and normal bowel sounds MUSCULOSKELETAL:no cyanosis of digits and no clubbing  NEURO: alert & oriented x 3 with fluent speech, no focal motor/sensory deficits EXTREMITIES: No lower extremity edema   LABORATORY DATA:  I have reviewed the data as listed CMP Latest Ref Rng & Units 12/16/2016 06/13/2015 08/22/2014  Glucose 70 - 140 mg/dl 90 103 99  BUN 7.0 - 26.0 mg/dL 10.2 10.7 7.9  Creatinine 0.6 - 1.1 mg/dL 0.7 0.7 0.7  Sodium 136 - 145 mEq/L 141 142 142  Potassium 3.5 - 5.1 mEq/L 4.1 4.0 3.9  Chloride 96 - 112 mEq/L - - -  CO2 22 - 29 mEq/L 25 29 29   Calcium 8.4 - 10.4 mg/dL 9.8 9.2 9.5  Total Protein 6.4 - 8.3 g/dL 7.2 6.7 7.0  Total Bilirubin 0.20 - 1.20 mg/dL 0.94 0.71 0.56  Alkaline Phos 40 - 150 U/L 75 67 53  AST 5 - 34 U/L 18 16 18   ALT 0 - 55 U/L 15 17 14     Lab Results  Component Value Date   WBC 6.2 12/16/2016   HGB 13.9 12/16/2016   HCT 41.5 12/16/2016   MCV 92.6 12/16/2016   PLT 197 12/16/2016   NEUTROABS 3.6 12/16/2016    ASSESSMENT & PLAN:  Breast cancer of upper-outer quadrant of left female breast stage I (T1 N0) invasive ductal carcinoma of the left breast diagnosed in January 2015. Biopsy initially revealed that ER positive PR positive HER-2/neu positive breast cancer. status post lumpectomy with sentinel lymph node biopsy. The final pathology revealed a 0.4 cm disease that again is triple positive. All lymph nodes were negative for metastatic disease. Completed Adjuvant radiation therapy on 04/07/2014, anastrozole 1 mg daily July 2015   2. monitoring closely for toxicities related to antiestrogen therapy.  Patient has mild stiffness in the fingers in the morning along with tingling of the tips of the fingers.  Neuropathy: Neurontin was prescribed 100 mg 3 times a day but it has not helped her. I discussed with her that she could stop taking it. I encouraged her to talk to her family physician about her neuropathy  issues.   3. Breast cancer surveillance:  A. mammogram done 09/23/2014 normal  B. breast exam done 12/16/17 normal   Return to clinic in 1 year for follow-up    I spent 25 minutes talking to the patient of which more than half was spent in counseling and coordination of care.  No orders of the defined types were placed in this encounter.  The patient has a good understanding of the overall plan. she agrees with it. she will call with any problems that may develop before the next visit here.   Harriette Ohara, MD 12/16/17

## 2017-12-18 ENCOUNTER — Telehealth: Payer: Self-pay | Admitting: Hematology and Oncology

## 2017-12-18 NOTE — Telephone Encounter (Signed)
Called to r/s appt per 1/22 sch message - per patient did not want to r/s they have moved to [place] , [place] -

## 2019-01-01 ENCOUNTER — Telehealth: Payer: Self-pay | Admitting: *Deleted

## 2019-01-01 NOTE — Telephone Encounter (Signed)
Medical records faxed to [facility] - [place], [place]; RI# [id number]

## 2022-06-07 ENCOUNTER — Other Ambulatory Visit: Payer: Self-pay

## 2022-06-07 ENCOUNTER — Encounter (HOSPITAL_BASED_OUTPATIENT_CLINIC_OR_DEPARTMENT_OTHER): Payer: Self-pay | Admitting: Emergency Medicine

## 2022-06-07 ENCOUNTER — Emergency Department (HOSPITAL_BASED_OUTPATIENT_CLINIC_OR_DEPARTMENT_OTHER)
Admission: EM | Admit: 2022-06-07 | Discharge: 2022-06-07 | Disposition: A | Discharge status: Home / Self Care | Payer: BC Managed Care – PPO | Attending: Emergency Medicine | Admitting: Emergency Medicine

## 2022-06-07 ENCOUNTER — Emergency Department (HOSPITAL_BASED_OUTPATIENT_CLINIC_OR_DEPARTMENT_OTHER): Payer: BC Managed Care – PPO

## 2022-06-07 DIAGNOSIS — N132 Hydronephrosis with renal and ureteral calculous obstruction: Secondary | ICD-10-CM | POA: Insufficient documentation

## 2022-06-07 DIAGNOSIS — R1031 Right lower quadrant pain: Secondary | ICD-10-CM

## 2022-06-07 DIAGNOSIS — D72829 Elevated white blood cell count, unspecified: Secondary | ICD-10-CM | POA: Insufficient documentation

## 2022-06-07 DIAGNOSIS — Z853 Personal history of malignant neoplasm of breast: Secondary | ICD-10-CM | POA: Insufficient documentation

## 2022-06-07 DIAGNOSIS — R109 Unspecified abdominal pain: Secondary | ICD-10-CM | POA: Diagnosis present

## 2022-06-07 DIAGNOSIS — N201 Calculus of ureter: Secondary | ICD-10-CM

## 2022-06-07 LAB — URINALYSIS, ROUTINE W REFLEX MICROSCOPIC
Bilirubin Urine: NEGATIVE
Glucose, UA: NEGATIVE mg/dL
Hgb urine dipstick: NEGATIVE
Ketones, ur: 40 mg/dL — AB
Nitrite: NEGATIVE
Protein, ur: 30 mg/dL — AB
Specific Gravity, Urine: 1.014 (ref 1.005–1.030)
pH: 7 (ref 5.0–8.0)

## 2022-06-07 LAB — COMPREHENSIVE METABOLIC PANEL
ALT: 11 U/L (ref 0–44)
AST: 14 U/L — ABNORMAL LOW (ref 15–41)
Albumin: 4.8 g/dL (ref 3.5–5.0)
Alkaline Phosphatase: 54 U/L (ref 38–126)
Anion gap: 13 (ref 5–15)
BUN: 10 mg/dL (ref 8–23)
CO2: 26 mmol/L (ref 22–32)
Calcium: 9.7 mg/dL (ref 8.9–10.3)
Chloride: 101 mmol/L (ref 98–111)
Creatinine, Ser: 0.75 mg/dL (ref 0.44–1.00)
GFR, Estimated: >60 mL/min (ref >60)
Glucose, Bld: 160 mg/dL — ABNORMAL HIGH (ref 70–99)
Potassium: 3.2 mmol/L — ABNORMAL LOW (ref 3.5–5.1)
Sodium: 140 mmol/L (ref 135–145)
Total Bilirubin: 1.1 mg/dL (ref 0.3–1.2)
Total Protein: 7.7 g/dL (ref 6.5–8.1)

## 2022-06-07 LAB — CBC
HCT: 41.2 % (ref 36.0–46.0)
Hemoglobin: 13.8 g/dL (ref 12.0–15.0)
MCH: 30.8 pg (ref 26.0–34.0)
MCHC: 33.5 g/dL (ref 30.0–36.0)
MCV: 92 fL (ref 80.0–100.0)
Platelets: 229 10*3/uL (ref 150–400)
RBC: 4.48 MIL/uL (ref 3.87–5.11)
RDW: 12.4 % (ref 11.5–15.5)
WBC: 12.9 10*3/uL — ABNORMAL HIGH (ref 4.0–10.5)
nRBC: 0 % (ref 0.0–0.2)

## 2022-06-07 LAB — LIPASE, BLOOD: Lipase: 16 U/L (ref 11–51)

## 2022-06-07 LAB — LACTIC ACID, PLASMA: Lactic Acid, Venous: 2.9 mmol/L (ref 0.5–1.9)

## 2022-06-07 MED ORDER — ONDANSETRON 4 MG PO TBDP: 4.0000 mg | ORAL_TABLET | Freq: Three times a day (TID) | ORAL | 0 refills | Status: AC | PRN

## 2022-06-07 MED ORDER — FENTANYL CITRATE PF 50 MCG/ML IJ SOSY
50.0000 ug | PREFILLED_SYRINGE | Freq: Once | INTRAMUSCULAR | Status: AC
  Administered 2022-06-07: 50 ug via INTRAVENOUS
  Filled 2022-06-07: qty 1

## 2022-06-07 MED ORDER — KETAMINE HCL 10 MG/ML IJ SOLN
INTRAMUSCULAR | Status: AC
  Filled 2022-06-07: qty 1

## 2022-06-07 MED ORDER — FENTANYL CITRATE PF 50 MCG/ML IJ SOSY
75.0000 ug | PREFILLED_SYRINGE | INTRAMUSCULAR | Status: DC | PRN
  Administered 2022-06-07: 75 ug via INTRAVENOUS
  Filled 2022-06-07: qty 2

## 2022-06-07 MED ORDER — NAPROXEN 500 MG PO TABS: 500.0000 mg | ORAL_TABLET | Freq: Two times a day (BID) | ORAL | 0 refills | Status: AC

## 2022-06-07 MED ORDER — FENTANYL CITRATE PF 50 MCG/ML IJ SOSY: 50.0000 ug | PREFILLED_SYRINGE | INTRAMUSCULAR | Status: DC | PRN

## 2022-06-07 MED ORDER — KETAMINE HCL 10 MG/ML IJ SOLN: 0.1000 mg/kg | Freq: Once | INTRAMUSCULAR | Status: DC

## 2022-06-07 MED ORDER — FENTANYL CITRATE PF 50 MCG/ML IJ SOSY: 50.0000 ug | PREFILLED_SYRINGE | INTRAMUSCULAR | Status: DC

## 2022-06-07 MED ORDER — IOHEXOL 300 MG/ML  SOLN: 100.0000 mL | Freq: Once | INTRAMUSCULAR | Status: DC | PRN

## 2022-06-07 MED ORDER — MORPHINE SULFATE (PF) 4 MG/ML IV SOLN: 4.0000 mg | Freq: Once | INTRAVENOUS | Status: DC

## 2022-06-07 MED ORDER — ONDANSETRON HCL 4 MG/2ML IJ SOLN
4.0000 mg | Freq: Once | INTRAMUSCULAR | Status: AC
  Administered 2022-06-07: 4 mg via INTRAVENOUS
  Filled 2022-06-07: qty 2

## 2022-06-07 MED ORDER — LACTATED RINGERS IV BOLUS
1000.0000 mL | Freq: Once | INTRAVENOUS | Status: AC
  Administered 2022-06-07: 1000 mL via INTRAVENOUS

## 2022-06-07 MED ORDER — HYDROMORPHONE HCL 2 MG PO TABS: 2.0000 mg | ORAL_TABLET | Freq: Four times a day (QID) | ORAL | 0 refills | Status: AC | PRN

## 2022-06-07 MED ORDER — FENTANYL CITRATE PF 50 MCG/ML IJ SOSY: 25.0000 ug | PREFILLED_SYRINGE | INTRAMUSCULAR | Status: DC

## 2022-06-07 NOTE — ED Notes (Signed)
RT to room for assessment r/t pts desaturations. Pt was on [place] 3 Lpm placed on by EMT-P for desat post pain medication admin. Pt taken off of oxygen by PA. Pt respiratory status stable at this time w/no distress noted at this time. RT will continue to monitor.

## 2022-06-07 NOTE — ED Notes (Addendum)
Oxygen placed on Pt after giving Fent. Oxygen level feel to the high 80's before O2. Current on 3lpm and at 97%. RT notified.

## 2022-06-07 NOTE — Discharge Instructions (Signed)
Take the Naprosyn as directed.  This may be enough to help control your pain.  Take the Zofran as needed for any nausea or vomiting.  Supplement with the hydromorphone tablets for more pain control.  CT scan showed 2 kidney stones on the right ureter.  1 is big.  May not be able to pass that 1.  Very important on Monday that she call urology for follow-up.  Return for any new or worse symptoms to include fever.  Or persistent vomiting.

## 2022-06-07 NOTE — ED Provider Notes (Signed)
[facility] Provider Note   CSN: [id number] Arrival date & time: 06/07/22  1938     History  Chief Complaint  Patient presents with   Flank Pain    Regina Martinez is a 61 y.o. female.   Flank Pain  Patient is a 61 year old female with a past medical history significant for 1 episode of ureterolithiasis many years ago, anxiety, breast cancer  Patient is presented emergency room today with complaints of right-sided back/flank pain that started around 1600 this afternoon seems to have progressively worsened since then.  She states that it feels somewhat similar to when she had a kidney stone years ago.  She states that she attempted to take some Tylenol for pain but was unable to keep this down.  Did have some improvement with topical IcyHot.  She notices nausea and vomiting.  She denies any chest pain or difficulty breathing.  No fevers.  She denies any urinary frequency urgency dysuria hematuria.  She denies any vaginal discharge or bleeding.     Home Medications Prior to Admission medications   Medication Sig Start Date End Date Taking? Authorizing Provider  anastrozole (ARIMIDEX) 1 MG tablet Take 1 tablet (1 mg total) by mouth daily. 12/31/16   Nicholas Lose, MD  Calcium Carb-Cholecalciferol (CALCIUM 600 + D PO) Take 1 tablet by mouth daily.    [provider]  Cholecalciferol (VITAMIN D-3 PO) Take 2,000 Int'l Units by mouth daily.    [provider]  gabapentin (NEURONTIN) 100 MG capsule TAKE ONE CAPSULE BY MOUTH 3 TIMES A DAY 06/22/15   Nicholas Lose, MD  hyaluronate sodium (RADIAPLEXRX) GEL Apply 1 application topically 2 (two) times daily.    [provider]  non-metallic deodorant Jethro Poling) MISC Apply 1 application topically daily as needed.    [provider]      Allergies    Patient has no known allergies.    Review of Systems   Review of Systems  Genitourinary:  Positive for flank pain.    Physical  Exam Updated Vital Signs BP 139/71   Pulse 95   Temp 97.8 F (36.6 C) (Oral)   Resp (!) 35   LMP 09/16/2012   SpO2 92%  Physical Exam Vitals and nursing note reviewed.  Constitutional:      General: She is in acute distress.     Comments: Extremely uncomfortable 61 year old female.  HENT:     Head: Normocephalic and atraumatic.     Nose: Nose normal.     Mouth/Throat:     Mouth: Mucous membranes are dry.  Eyes:     General: No scleral icterus. Cardiovascular:     Rate and Rhythm: Normal rate and regular rhythm.     Pulses: Normal pulses.     Heart sounds: Normal heart sounds.  Pulmonary:     Effort: Pulmonary effort is normal. No respiratory distress.     Breath sounds: No wheezing.  Abdominal:     Palpations: Abdomen is soft.     Tenderness: There is abdominal tenderness. There is no guarding or rebound.     Comments: Right lower quadrant tenderness, right CVA tenderness, right periumbilical tenderness  Musculoskeletal:     Cervical back: Normal range of motion.     Right lower leg: No edema.     Left lower leg: No edema.  Skin:    General: Skin is warm and dry.     Capillary Refill: Capillary refill takes less than 2 seconds.  Neurological:  Mental Status: She is alert. Mental status is at baseline.  Psychiatric:        Mood and Affect: Mood normal.        Behavior: Behavior normal.     ED Results / Procedures / Treatments   Labs (all labs ordered are listed, but only abnormal results are displayed) Labs Reviewed  URINALYSIS, ROUTINE W REFLEX MICROSCOPIC - Abnormal; Notable for the following components:      Result Value   APPearance HAZY (*)    Ketones, ur 40 (*)    Protein, ur 30 (*)    Leukocytes,Ua MODERATE (*)    Bacteria, UA MANY (*)    All other components within normal limits  CBC - Abnormal; Notable for the following components:   WBC 12.9 (*)    All other components within normal limits  URINE CULTURE  LACTIC ACID, PLASMA  LACTIC ACID,  PLASMA  LIPASE, BLOOD  COMPREHENSIVE METABOLIC PANEL    EKG EKG Interpretation  Date/Time:  Friday June 07 2022 21:48:56 EDT Ventricular Rate:  95 PR Interval:  135 QRS Duration: 96 QT Interval:  370 QTC Calculation: 466 R Axis:   31 Text Interpretation: Sinus rhythm Atrial premature complexes Abnormal R-wave progression, early transition Nonspecific T abnrm, anterolateral leads Confirmed by Fredia Sorrow [phone]) on 06/07/2022 9:55:47 PM  Radiology No results found.  Procedures Procedures    Medications Ordered in ED Medications  fentaNYL (SUBLIMAZE) injection 75 mcg (75 mcg Intravenous Given 06/07/22 2107)  ketamine (KETALAR) injection 0.1 mg/kg (has no administration in time range)  ondansetron (ZOFRAN) injection 4 mg (4 mg Intravenous Given 06/07/22 2106)  lactated ringers bolus 1,000 mL (1,000 mLs Intravenous New Bag/Given 06/07/22 2116)    ED Course/ Medical Decision Making/ A&P                           Medical Decision Making Amount and/or Complexity of Data Reviewed Labs: ordered. Radiology: ordered.  Risk Prescription drug management.   This patient presents to the ED for concern of right flank pain, this involves a number of treatment options, and is a complaint that carries with it a moderate to high risk of complications and morbidity.  The differential diagnosis includes The differential diagnosis of emergent flank pain includes, but is not limited to :Abdominal aortic aneurysm,, Renal artery embolism,Renal vein thrombosis, Aortic dissection, Mesenteric ischemia, Pyelonephritis, Renal infarction, Renal hemorrhage, Nephrolithiasis/ Renal Colic, Bladder tumor,Cystitis, Biliary colic, Pancreatitis Perforated peptic ulcer Appendicitis ,Inguinal Hernia, Diverticulitis, Bowel obstruction (Ectopic Pregnancy,PID/TOA,Ovarian cyst, Ovarian torsion) Lower lobe pneumonia, Retroperitoneal hematoma/abscess/tumor, Epidural abscess, Epidural hematoma    Co  morbidities: Discussed in HPI   Brief History:  Patient is a 61 year old female with a past medical history significant for 1 episode of ureterolithiasis many years ago, anxiety, breast cancer  Patient is presented emergency room today with complaints of right-sided back/flank pain that started around 1600 this afternoon seems to have progressively worsened since then.  She states that it feels somewhat similar to when she had a kidney stone years ago.  She states that she attempted to take some Tylenol for pain but was unable to keep this down.  Did have some improvement with topical IcyHot.  She notices nausea and vomiting.  She denies any chest pain or difficulty breathing.  No fevers.  She denies any urinary frequency urgency dysuria hematuria.  She denies any vaginal discharge or bleeding.    EMR reviewed including pt PMHx, past surgical  history and past visits to ER.   See HPI for more details   Lab Tests:   I ordered and independently interpreted labs. Labs notable for leukocytosis of 12.9.  Urinalysis with many bacteria moderate leukocytes  Other labs pending at this time.   Imaging Studies:  CT abdomen pelvis with contrast ordered.    Cardiac Monitoring:  The patient was maintained on a cardiac monitor.  I personally viewed and interpreted the cardiac monitored which showed an underlying rhythm of: NSR EKG non-ischemic   Medicines ordered:  I ordered medication including fentanyl, 1 L of LR, Zofran for pain Reevaluation of the patient after these medicines showed that the patient improved marginally improved I have reviewed the patients home medicines and have made adjustments as needed   Critical Interventions:     Consults/Attending Physician   Dr. Rogene Houston will take patient care at hand off at this time.   Reevaluation:  After the interventions noted above I re-evaluated patient and found that they have :improved   Social Determinants of  Health:      Problem List / ED Course:  Flank pain.  Patient currently in CT for CT renal stone study.  CMP unremarkable apart from mild hypokalemia.     Dispostion:  Patient care handed off to Dr. Rogene Houston at shift change.    Final Clinical Impression(s) / ED Diagnoses Final diagnoses:  RLQ abdominal pain    Rx / DC Orders ED Discharge Orders     None         Tedd Sias, [place] 06/07/22 2215    Fredia Sorrow, MD 06/07/22 2342

## 2022-06-07 NOTE — ED Notes (Signed)
Pt verbalizes understanding of discharge instructions. Opportunity for questioning and answers were provided. Pt discharged from ED to home with husband.    

## 2022-06-07 NOTE — ED Notes (Addendum)
Pt given additional Fent prior to CT. Pt was transported to CT, Medic went with the Pt and the Pt was left on the monitor. Pts O2 was noted to have dropped to low 80's during transport. Pt placed on O2 during transport. Placed on 3lpm [place], O2 increased to 98%. RT and Provider notified.

## 2022-06-07 NOTE — ED Notes (Signed)
Reports having right sided flank pain. Rated 10/10 upon arrival. Started within the last few hours. Has had kidney stones before and this feels similar. Pt into much pain to continue assessment. Provider aware.

## 2022-06-07 NOTE — ED Triage Notes (Signed)
Started with back pain into right flank around 1600. Took tylenol but vomit. Icy hot helped some but now getting worse. Hx kidney stones Appears very uncomfortable in triage

## 2022-06-07 NOTE — ED Notes (Signed)
Lactate 2.9 read back verified with Simona Huh in lab

## 2022-06-10 LAB — URINE CULTURE: Culture: >=100000 — AB

## 2022-06-11 ENCOUNTER — Observation Stay (HOSPITAL_COMMUNITY): Payer: BC Managed Care – PPO

## 2022-06-11 ENCOUNTER — Encounter (HOSPITAL_COMMUNITY)
Admission: EM | Disposition: A | Discharge status: Home / Self Care | Payer: Self-pay | Source: Ambulatory Visit | Attending: Urology

## 2022-06-11 ENCOUNTER — Observation Stay (HOSPITAL_COMMUNITY)
Admission: EM | Admit: 2022-06-11 | Discharge: 2022-06-12 | Disposition: A | Discharge status: Home / Self Care | Payer: BC Managed Care – PPO | Source: Ambulatory Visit | Attending: Urology | Admitting: Urology

## 2022-06-11 ENCOUNTER — Other Ambulatory Visit: Payer: Self-pay | Admitting: Urology

## 2022-06-11 ENCOUNTER — Other Ambulatory Visit: Payer: Self-pay

## 2022-06-11 ENCOUNTER — Encounter (HOSPITAL_COMMUNITY): Payer: Self-pay | Admitting: Urology

## 2022-06-11 ENCOUNTER — Inpatient Hospital Stay (HOSPITAL_COMMUNITY): Payer: BC Managed Care – PPO | Admitting: Anesthesiology

## 2022-06-11 ENCOUNTER — Telehealth: Payer: Self-pay

## 2022-06-11 DIAGNOSIS — N201 Calculus of ureter: Secondary | ICD-10-CM | POA: Diagnosis present

## 2022-06-11 DIAGNOSIS — N132 Hydronephrosis with renal and ureteral calculous obstruction: Secondary | ICD-10-CM | POA: Diagnosis not present

## 2022-06-11 DIAGNOSIS — N3000 Acute cystitis without hematuria: Secondary | ICD-10-CM | POA: Insufficient documentation

## 2022-06-11 DIAGNOSIS — Z79899 Other long term (current) drug therapy: Secondary | ICD-10-CM | POA: Insufficient documentation

## 2022-06-11 HISTORY — PX: CYSTOSCOPY W/ URETERAL STENT PLACEMENT: SHX1429

## 2022-06-11 LAB — HEMOGLOBIN A1C
Hgb A1c MFr Bld: 5.7 % — ABNORMAL HIGH (ref 4.8–5.6)
Mean Plasma Glucose: 116.89 mg/dL

## 2022-06-11 LAB — GLUCOSE, CAPILLARY: Glucose-Capillary: 191 mg/dL — ABNORMAL HIGH (ref 70–99)

## 2022-06-11 LAB — HIV ANTIBODY (ROUTINE TESTING W REFLEX): HIV Screen 4th Generation wRfx: NONREACTIVE

## 2022-06-11 SURGERY — CYSTOSCOPY, WITH RETROGRADE PYELOGRAM AND URETERAL STENT INSERTION
Anesthesia: General | Site: Ureter | Laterality: Right

## 2022-06-11 MED ORDER — ONDANSETRON HCL 4 MG/2ML IJ SOLN
INTRAMUSCULAR | Status: DC | PRN
  Administered 2022-06-11: 4 mg via INTRAVENOUS

## 2022-06-11 MED ORDER — IOHEXOL 300 MG/ML  SOLN
INTRAMUSCULAR | Status: DC | PRN
  Administered 2022-06-11: 10 mL

## 2022-06-11 MED ORDER — PROPOFOL 10 MG/ML IV BOLUS
INTRAVENOUS | Status: DC | PRN
  Administered 2022-06-11: 150 mg via INTRAVENOUS

## 2022-06-11 MED ORDER — DEXAMETHASONE SODIUM PHOSPHATE 4 MG/ML IJ SOLN
INTRAMUSCULAR | Status: DC | PRN
  Administered 2022-06-11: 5 mg via INTRAVENOUS

## 2022-06-11 MED ORDER — SENNA 8.6 MG PO TABS
1.0000 | ORAL_TABLET | Freq: Two times a day (BID) | ORAL | Status: DC
Start: 2022-06-11 — End: 2022-06-12
  Administered 2022-06-11 – 2022-06-12 (×2): 8.6 mg via ORAL
  Filled 2022-06-11 (×2): qty 1

## 2022-06-11 MED ORDER — DIPHENHYDRAMINE HCL 12.5 MG/5ML PO ELIX: 12.5000 mg | ORAL_SOLUTION | Freq: Four times a day (QID) | ORAL | Status: DC | PRN

## 2022-06-11 MED ORDER — SODIUM CHLORIDE 0.9 % IV SOLN: INTRAVENOUS | Status: DC

## 2022-06-11 MED ORDER — OXYCODONE HCL 5 MG PO TABS
5.0000 mg | ORAL_TABLET | ORAL | Status: DC | PRN
  Administered 2022-06-11: 5 mg via ORAL
  Filled 2022-06-11: qty 1

## 2022-06-11 MED ORDER — FENTANYL CITRATE (PF) 100 MCG/2ML IJ SOLN
INTRAMUSCULAR | Status: AC
  Filled 2022-06-11: qty 2

## 2022-06-11 MED ORDER — FENTANYL CITRATE PF 50 MCG/ML IJ SOSY: 25.0000 ug | PREFILLED_SYRINGE | INTRAMUSCULAR | Status: DC | PRN

## 2022-06-11 MED ORDER — LIDOCAINE 2% (20 MG/ML) 5 ML SYRINGE
INTRAMUSCULAR | Status: DC | PRN
  Administered 2022-06-11: 60 mg via INTRAVENOUS

## 2022-06-11 MED ORDER — MORPHINE SULFATE (PF) 2 MG/ML IV SOLN: 2.0000 mg | INTRAVENOUS | Status: DC | PRN

## 2022-06-11 MED ORDER — DEXAMETHASONE SODIUM PHOSPHATE 10 MG/ML IJ SOLN
INTRAMUSCULAR | Status: AC
Start: 2022-06-11 — End: ?
  Filled 2022-06-11: qty 1

## 2022-06-11 MED ORDER — ONDANSETRON HCL 4 MG/2ML IJ SOLN: 4.0000 mg | Freq: Once | INTRAMUSCULAR | Status: DC | PRN

## 2022-06-11 MED ORDER — OXYBUTYNIN CHLORIDE 5 MG PO TABS: 5.0000 mg | ORAL_TABLET | Freq: Three times a day (TID) | ORAL | Status: DC | PRN

## 2022-06-11 MED ORDER — OXYCODONE HCL 5 MG PO TABS: 5.0000 mg | ORAL_TABLET | Freq: Once | ORAL | Status: DC | PRN

## 2022-06-11 MED ORDER — LIDOCAINE HCL (PF) 2 % IJ SOLN
INTRAMUSCULAR | Status: AC
  Filled 2022-06-11: qty 5

## 2022-06-11 MED ORDER — SULFAMETHOXAZOLE-TRIMETHOPRIM 800-160 MG PO TABS
1.0000 | ORAL_TABLET | Freq: Two times a day (BID) | ORAL | Status: DC
  Administered 2022-06-12: 1 via ORAL
  Filled 2022-06-11: qty 1

## 2022-06-11 MED ORDER — DIPHENHYDRAMINE HCL 50 MG/ML IJ SOLN: 12.5000 mg | Freq: Four times a day (QID) | INTRAMUSCULAR | Status: DC | PRN

## 2022-06-11 MED ORDER — PHENYLEPHRINE 80 MCG/ML (10ML) SYRINGE FOR IV PUSH (FOR BLOOD PRESSURE SUPPORT)
PREFILLED_SYRINGE | INTRAVENOUS | Status: AC
  Filled 2022-06-11: qty 10

## 2022-06-11 MED ORDER — FENTANYL CITRATE (PF) 100 MCG/2ML IJ SOLN
INTRAMUSCULAR | Status: DC | PRN
  Administered 2022-06-11 (×2): 50 ug via INTRAVENOUS

## 2022-06-11 MED ORDER — SODIUM CHLORIDE 0.9 % IR SOLN
Status: DC | PRN
  Administered 2022-06-11: 3000 mL

## 2022-06-11 MED ORDER — SODIUM CHLORIDE 0.9 % IV SOLN
2.0000 g | INTRAVENOUS | Status: AC
  Administered 2022-06-11: 2 g via INTRAVENOUS
  Filled 2022-06-11: qty 20

## 2022-06-11 MED ORDER — OXYCODONE HCL 5 MG/5ML PO SOLN: 5.0000 mg | Freq: Once | ORAL | Status: DC | PRN

## 2022-06-11 MED ORDER — ACETAMINOPHEN 325 MG PO TABS: 650.0000 mg | ORAL_TABLET | ORAL | Status: DC | PRN

## 2022-06-11 MED ORDER — INSULIN ASPART 100 UNIT/ML IJ SOLN: 0.0000 [IU] | Freq: Three times a day (TID) | INTRAMUSCULAR | Status: DC

## 2022-06-11 MED ORDER — ROSUVASTATIN CALCIUM 10 MG PO TABS
10.0000 mg | ORAL_TABLET | Freq: Every day | ORAL | Status: DC
Start: 2022-06-11 — End: 2022-06-12
  Administered 2022-06-11: 10 mg via ORAL
  Filled 2022-06-11: qty 1

## 2022-06-11 MED ORDER — ZOLPIDEM TARTRATE 5 MG PO TABS: 5.0000 mg | ORAL_TABLET | Freq: Every evening | ORAL | Status: DC | PRN

## 2022-06-11 MED ORDER — ACETAMINOPHEN 500 MG PO TABS
1000.0000 mg | ORAL_TABLET | Freq: Once | ORAL | Status: AC
  Administered 2022-06-11: 1000 mg via ORAL
  Filled 2022-06-11: qty 2

## 2022-06-11 MED ORDER — BETAMETHASONE DIPROPIONATE 0.05 % EX LOTN
6.0000 [drp] | TOPICAL_LOTION | Freq: Every day | CUTANEOUS | Status: DC
Start: 2022-06-12 — End: 2022-06-12

## 2022-06-11 MED ORDER — ONDANSETRON HCL 4 MG/2ML IJ SOLN: 4.0000 mg | INTRAMUSCULAR | Status: DC | PRN

## 2022-06-11 MED ORDER — LACTATED RINGERS IV SOLN: INTRAVENOUS | Status: DC

## 2022-06-11 MED ORDER — ONDANSETRON HCL 4 MG/2ML IJ SOLN
INTRAMUSCULAR | Status: AC
  Filled 2022-06-11: qty 2

## 2022-06-11 MED ORDER — EPHEDRINE SULFATE-NACL 50-0.9 MG/10ML-% IV SOSY
PREFILLED_SYRINGE | INTRAVENOUS | Status: DC | PRN
  Administered 2022-06-11: 5 mg via INTRAVENOUS

## 2022-06-11 MED ORDER — DOCUSATE SODIUM 100 MG PO CAPS
100.0000 mg | ORAL_CAPSULE | Freq: Two times a day (BID) | ORAL | Status: DC
  Administered 2022-06-11 – 2022-06-12 (×2): 100 mg via ORAL
  Filled 2022-06-11 (×2): qty 1

## 2022-06-11 MED ORDER — EPHEDRINE 5 MG/ML INJ
INTRAVENOUS | Status: AC
  Filled 2022-06-11: qty 5

## 2022-06-11 MED ORDER — APREMILAST 30 MG PO TABS: 30.0000 mg | ORAL_TABLET | Freq: Two times a day (BID) | ORAL | Status: DC

## 2022-06-11 MED ORDER — PHENYLEPHRINE 80 MCG/ML (10ML) SYRINGE FOR IV PUSH (FOR BLOOD PRESSURE SUPPORT)
PREFILLED_SYRINGE | INTRAVENOUS | Status: DC | PRN
  Administered 2022-06-11: 120 ug via INTRAVENOUS
  Administered 2022-06-11: 160 ug via INTRAVENOUS
  Administered 2022-06-11 (×2): 120 ug via INTRAVENOUS

## 2022-06-11 SURGICAL SUPPLY — 12 items
BAG URO CATCHER STRL LF (MISCELLANEOUS) ×2 IMPLANT
CATH URETL OPEN END 6FR 70 (CATHETERS) ×2 IMPLANT
CLOTH BEACON ORANGE TIMEOUT ST (SAFETY) ×2 IMPLANT
GLOVE BIO SURGEON STRL SZ7.5 (GLOVE) ×2 IMPLANT
GOWN STRL REUS W/ TWL XL LVL3 (GOWN DISPOSABLE) ×1 IMPLANT
GOWN STRL REUS W/TWL XL LVL3 (GOWN DISPOSABLE) ×2
GUIDEWIRE STR DUAL SENSOR (WIRE) ×2 IMPLANT
MANIFOLD NEPTUNE II (INSTRUMENTS) ×2 IMPLANT
PACK CYSTO (CUSTOM PROCEDURE TRAY) ×2 IMPLANT
STENT URET 6FRX24 CONTOUR (STENTS) ×1 IMPLANT
TUBING CONNECTING 10 (TUBING) ×2 IMPLANT
TUBING UROLOGY SET (TUBING) IMPLANT

## 2022-06-11 NOTE — Op Note (Signed)
Operative Note  Preoperative diagnosis:  1.  Right ureteral calculus, UTI  Post operative diagnosis: 1.  Right ureteral calculus, UTI  Procedure(s): 1.  Cystoscopy with right retrograde pyelogram and right ureteral stent placement  Surgeon: Link Snuffer, MD  Assistants: None  Anesthesia: General  Complications: None immediate  EBL: Minimal  Specimens: 1.  None  Drains/Catheters: 1.  6 X 24 double-J ureteral stent  Intraoperative findings: 1.  Normal urethra and bladder 2.  Right retrograde pyelogram revealed a filling defect at the level of the stone with upstream hydroureteronephrosis  Indication: 61 year old female originally presented to the emergency department on 7/14.  CT scan was performed that revealed 1 cm right ureteral calculus with upstream hydronephrosis.  Ultimately, urine culture grew Klebsiella.  She has not been on antibiotics.  She presented to the office today with progressive malaise but normal vital signs.  She had continued right-sided flank pain.  Decision was made to proceed with urgent right ureteral stent placement.  Description of procedure:  The patient was identified and consent was obtained.  The patient was taken to the operating room and placed in the supine position.  The patient was placed under general anesthesia.  Perioperative antibiotics were administered.  The patient was placed in dorsal lithotomy.  Patient was prepped and draped in a standard sterile fashion and a timeout was performed.  A 21 French rigid cystoscope was advanced into the urethra and into the bladder.  The right distal most portion of the ureter was cannulated with an open-ended ureteral catheter.  Retrograde pyelogram was performed with the findings noted above.  A sensor wire was then advanced up to the kidney under fluoroscopic guidance.  A 6 X 24 double-J ureteral stent was advanced up to the kidney under fluoroscopic guidance.  The wire was withdrawn and fluoroscopy  confirmed good proximal placement and direct visualization confirmed a good coil within the bladder.  The bladder was drained and the scope withdrawn.  This concluded the operation.  Patient tolerated procedure well and was stable postoperatively.  Plan: We will keep the patient overnight for observation.  Likely discharge home tomorrow on Bactrim.

## 2022-06-11 NOTE — Transfer of Care (Signed)
Immediate Anesthesia Transfer of Care Note  Patient: Regina Martinez  Procedure(s) Performed: CYSTOSCOPY WITH RETROGRADE PYELOGRAM/URETERAL STENT PLACEMENT (Right: Ureter)  Patient Location: PACU  Anesthesia Type:General  Level of Consciousness: drowsy  Airway & Oxygen Therapy: Patient Spontanous Breathing and Patient connected to face mask  Post-op Assessment: Report given to RN and Post -op Vital signs reviewed and stable  Post vital signs: Reviewed and stable  Last Vitals:  Vitals Value Taken Time  BP 95/36 06/11/22 1754  Temp    Pulse 88 06/11/22 1755  Resp 23 06/11/22 1755  SpO2 98 % 06/11/22 1755  Vitals shown include unvalidated device data.  Last Pain:  Vitals:   06/11/22 1553  TempSrc:   PainSc: 4       Patients Stated Pain Goal: 2 (87/57/97 2820)  Complications: No notable events documented.

## 2022-06-11 NOTE — H&P (Signed)
CC/HPI: CC: Right ureteral calculus  HPI:  06/11/2022  61 year old female had a kidney stone in the past that she passed about 10 years ago. She went to the emergency department on 06/07/2022 with right-sided flank pain. CT scan was performed that showed 2 proximal right ureteral stones with mild right hydronephrosis the largest stone was 10 mm. Smaller stone was 5 mm. Urine culture that day was positive for Klebsiella. She was not placed on antibiotics. She has generally felt unwell. She has some intermittent pain and discomfort. Pain is controlled currently. No fever, chill, nausea, vomiting. Vital signs are stable here today.     ALLERGIES: No Allergies    MEDICATIONS: Anastrozole 1 mg tablet  Calcium + D  Gabapentin 100 mg capsule  Hyaluronate Gel  Hydromorphone Hcl 2 mg tablet  Naproxen 500 mg tablet  Ondansetron Odt 4 mg tablet,disintegrating  Otezla 30 mg tablet  Vitamin D3     GU PSH: None     PSH Notes: Cesarean Section   NON-GU PSH: Breast lumpectomy, Left Cesarean Delivery Only - 2014     GU PMH: Renal calculus, Nephrolithiasis - 2014 Ureteral calculus, Calculus of ureter - 2014      PMH Notes:  1898-11-25 00:00:00 - Note: Normal Routine History And Physical Adult   NON-GU PMH: None   FAMILY HISTORY: Death In The Family Father - Runs In Family Death In The Family Mother - Runs In Family Family Health Status Number - Runs In Family   SOCIAL HISTORY: Marital Status: Married Preferred Language: Other; Ethnicity: Not Hispanic Or Latino; Race: Other Race Current Smoking Status: Patient has never smoked.   Tobacco Use Assessment Completed: Used Tobacco in last 30 days? Does not use smokeless tobacco. Has never drank.  Does not use drugs. Drinks 3 caffeinated drinks per day.     Notes: Never A Smoker, Occupation:, Marital History - Currently Married, Tobacco Use, Alcohol Use   REVIEW OF SYSTEMS:    GU Review Female:   Patient denies frequent urination, hard to  postpone urination, burning /pain with urination, get up at night to urinate, leakage of urine, stream starts and stops, trouble starting your stream, have to strain to urinate, and being pregnant.  Gastrointestinal (Upper):   Patient denies nausea, indigestion/ heartburn, and vomiting.  Gastrointestinal (Lower):   Patient denies diarrhea and constipation.  Constitutional:   Patient denies fever, night sweats, weight loss, and fatigue.  Skin:   Patient denies skin rash/ lesion and itching.  Eyes:   Patient denies blurred vision and double vision.  Ears/ Nose/ Throat:   Patient denies sore throat and sinus problems.  Hematologic/Lymphatic:   Patient denies swollen glands and easy bruising.  Cardiovascular:   Patient denies leg swelling and chest pains.  Respiratory:   Patient denies cough and shortness of breath.  Endocrine:   Patient denies excessive thirst.  Musculoskeletal:   Patient denies back pain and joint pain.  Neurological:   Patient denies headaches and dizziness.  Psychologic:   Patient denies depression and anxiety.   VITAL SIGNS:      06/11/2022 08:45 AM  Weight 150 lb / 68.04 kg  Height 62 in / 157.48 cm  BP 114/59 mmHg  Pulse 87 /min  Temperature 97.5 F / 36.3 C  BMI 27.4 kg/m   MULTI-SYSTEM PHYSICAL EXAMINATION:    Constitutional: Well-nourished. No physical deformities. Normally developed. Good grooming.  Respiratory: No labored breathing, no use of accessory muscles.   Cardiovascular: Normal temperature, normal extremity pulses,  no swelling, no varicosities.  Skin: No paleness, no jaundice, no cyanosis. No lesion, no ulcer, no rash.  Neurologic / Psychiatric: Oriented to time, oriented to place, oriented to person. No depression, no anxiety, no agitation.  Gastrointestinal: No mass, no tenderness, no rigidity, non obese abdomen. Mild right CVA tenderness  Eyes: Normal conjunctivae. Normal eyelids.  Musculoskeletal: Normal gait and station of head and neck.      Complexity of Data:  Source Of History:  Patient  Records Review:   Previous Doctor Records, Previous Patient Records  Urine Test Review:   Urinalysis, Urine Culture  X-Ray Review: C.T. Abdomen/Pelvis: Reviewed Films. Reviewed Report. Discussed With Patient.     PROCEDURES:          Urinalysis w/Scope Dipstick Dipstick Cont'd Micro  Color: Yellow Bilirubin: Neg mg/dL WBC/hpf: 40 - 60/hpf  Appearance: Cloudy Ketones: 2+ mg/dL RBC/hpf: 3 - 10/hpf  Specific Gravity: 1.025 Blood: 3+ ery/uL Bacteria: Many (>50/hpf)  pH: 6.5 Protein: 1+ mg/dL Cystals: NS (Not Seen)  Glucose: Neg mg/dL Urobilinogen: 1.0 mg/dL Casts: NS (Not Seen)    Nitrites: Positive Trichomonas: Not Present    Leukocyte Esterase: 2+ leu/uL Mucous: Present      Epithelial Cells: 10 - 20/hpf      Yeast: NS (Not Seen)      Sperm: Not Present         Ceftriaxone 1g - 48185, U3149 Qty: 1 Adm. By: Alcide Goodness  Unit: gram Lot No [id number]  Route: IM Exp. Date 12/27/2023  Freq: None Mfgr.:   Site: Right Hip   ASSESSMENT:      ICD-10 Details  1 GU:   Ureteral calculus - N20.1 Undiagnosed New Problem  2   Ureteral obstruction secondary to calculous - N13.2 Undiagnosed New Problem  3   Acute Cystitis/UTI - N30.00 Undiagnosed New Problem   PLAN:            Medications New Meds: Bactrim Ds 800 mg-160 mg tablet 1 tablet PO BID   #20  0 Refill(s)  Pharmacy Name:  [id number] Address:  [address]  [place] [address] Phone:  [phone] Fax:  [phone]           Orders Labs Urine Culture          Document Letter(s):  Created for Patient: Clinical Summary         Notes:   We discussed the management of urinary stones. These options include observation, ureteroscopy, and shockwave lithotripsy. We discussed which options are relevant to these particular stones. We discussed the natural history of stones as well as the complications of untreated stones and the impact on quality of life  without treatment as well as with each of the above listed treatments. We also discussed the efficacy of each treatment in its ability to clear the stone burden. With any of these management options I discussed the signs and symptoms of infection and the need for emergent treatment should these be experienced. For each option we discussed the ability of each procedure to clear the patient of their stone burden.   For observation I described the risks which include but are not limited to silent renal damage, life-threatening infection, need for emergent surgery, failure to pass stone, and pain.   For ureteroscopy I described the risks which include heart attack, stroke, pulmonary embolus, death, bleeding, infection, damage to contiguous structures, positioning injury, ureteral stricture, ureteral avulsion, ureteral injury, need for ureteral  stent, inability to perform ureteroscopy, need for an interval procedure, inability to clear stone burden, stent discomfort and pain.   For shockwave lithotripsy I described the risks which include arrhythmia, kidney contusion, kidney hemorrhage, need for transfusion, pain, inability to break up stone, inability to pass stone fragments, Steinstrasse, infection associated with obstructing stones, need for different surgical procedure, need for repeat shockwave lithotripsy.   Given that she had a positive urine culture and persistent symptoms, recommend ureteral stent placement tonight and starting antibiotics. We will keep her overnight for observation. Likely discharge with Bactrim which was sent from our office.   Plan for ureteroscopy in 1 to 2 weeks.     Signed by Link Snuffer, III, M.D. on 06/11/22 at 9:48 AM (EDT

## 2022-06-11 NOTE — Discharge Instructions (Addendum)
[facility] [phone] Post Ureteroscopy With or Without Stent Instructions  Antibiotics were already sent to our pharmacy from my office.  Complete Bactrim twice daily as directed.  Definitions:  Ureter: The duct that transports urine from the kidney to the bladder. Stent:   A plastic hollow tube that is placed into the ureter, from the kidney to the                 bladder to prevent the ureter from swelling shut.  GENERAL INSTRUCTIONS:  Despite the fact that no skin incisions were used, the area around the ureter and bladder is raw and irritated. The stent is a foreign body which will further irritate the bladder wall. This irritation is manifested by increased frequency of urination, both day and night, and by an increase in the urge to urinate. In some, the urge to urinate is present almost always. Sometimes the urge is strong enough that you may not be able to stop yourself from urinating. The only real cure is to remove the stent and then give time for the bladder wall to heal which can't be done until the danger of the ureter swelling shut has passed, which varies.  You may see some blood in your urine while the stent is in place and a few days afterwards. Do not be alarmed, even if the urine was clear for a while. Get off your feet and drink lots of fluids until clearing occurs. If you start to pass clots or don't improve, call us.  DIET: You may return to your normal diet immediately. Because of the raw surface of your bladder, alcohol, spicy foods, acid type foods and drinks with caffeine may cause irritation or frequency and should be used in moderation. To keep your urine flowing freely and to avoid constipation, drink plenty of fluids during the day ( 8-10 glasses ). Tip: Avoid cranberry juice because it is very acidic.  ACTIVITY: Your physical activity doesn't need to be restricted. However, if you are very active, you may see some blood in your urine. We  suggest that you reduce your activity under these circumstances until the bleeding has stopped.  BOWELS: It is important to keep your bowels regular during the postoperative period. Straining with bowel movements can cause bleeding. A bowel movement every other day is reasonable. Use a mild laxative if needed, such as Milk of Magnesia 2-3 tablespoons, or 2 Dulcolax tablets. Call if you continue to have problems. If you have been taking narcotics for pain, before, during or after your surgery, you may be constipated. Take a laxative if necessary.   MEDICATION: You should resume your pre-surgery medications unless told not to. You may take oxybutynin or flomax if prescribed for bladder spasms or discomfort from the stent Take pain medication as directed for pain refractory to conservative management  PROBLEMS YOU SHOULD REPORT TO [place]: Fevers over 100.5 Fahrenheit. Heavy bleeding, or clots ( See above notes about blood in urine ). Inability to urinate. Drug reactions ( hives, rash, nausea, vomiting, diarrhea ). Severe burning or pain with urination that is not improving.

## 2022-06-11 NOTE — Anesthesia Postprocedure Evaluation (Signed)
Anesthesia Post Note  Patient: Regina Martinez  Procedure(s) Performed: CYSTOSCOPY WITH RETROGRADE PYELOGRAM/URETERAL STENT PLACEMENT (Right: Ureter)     Patient location during evaluation: PACU Anesthesia Type: General Level of consciousness: awake and alert Pain management: pain level controlled Vital Signs Assessment: post-procedure vital signs reviewed and stable Respiratory status: spontaneous breathing, nonlabored ventilation and respiratory function stable Cardiovascular status: stable and blood pressure returned to baseline Anesthetic complications: no   No notable events documented.  Last Vitals:  Vitals:   06/11/22 1830 06/11/22 1845  BP: (!) 97/52 (!) 107/46  Pulse: 80 80  Resp: (!) 24 (!) 24  Temp: 36.5 C 36.5 C  SpO2: 100% 95%    Last Pain:  Vitals:   06/11/22 1845  TempSrc: Oral  PainSc:                  Audry Pili

## 2022-06-11 NOTE — Anesthesia Preprocedure Evaluation (Addendum)
Anesthesia Evaluation  Patient identified by MRN, date of birth, ID band Patient awake    Reviewed: Allergy & Precautions, NPO status , Patient's Chart, lab work & pertinent test results  History of Anesthesia Complications Negative for: history of anesthetic complications  Airway Mallampati: II  TM Distance: >3 FB Neck ROM: Full    Dental  (+) Dental Advisory Given   Pulmonary neg pulmonary ROS,    Pulmonary exam normal        Cardiovascular negative cardio ROS Normal cardiovascular exam     Neuro/Psych PSYCHIATRIC DISORDERS Anxiety negative neurological ROS     GI/Hepatic negative GI ROS, Neg liver ROS,   Endo/Other  negative endocrine ROS  Renal/GU  right ureteral stone      Musculoskeletal negative musculoskeletal ROS (+)   Abdominal   Peds  Hematology negative hematology ROS (+)   Anesthesia Other Findings   Reproductive/Obstetrics  Breast cancer                             Anesthesia Physical Anesthesia Plan  ASA: 2  Anesthesia Plan: General   Post-op Pain Management: Tylenol PO (pre-op)*   Induction: Intravenous  PONV Risk Score and Plan: 3 and Treatment may vary due to age or medical condition, Ondansetron and Dexamethasone  Airway Management Planned: LMA  Additional Equipment: None  Intra-op Plan:   Post-operative Plan: Extubation in OR  Informed Consent: I have reviewed the patients History and Physical, chart, labs and discussed the procedure including the risks, benefits and alternatives for the proposed anesthesia with the patient or authorized representative who has indicated his/her understanding and acceptance.     Dental advisory given  Plan Discussed with: CRNA and Anesthesiologist  Anesthesia Plan Comments:        Anesthesia Quick Evaluation

## 2022-06-11 NOTE — Anesthesia Procedure Notes (Signed)
Procedure Name: LMA Insertion Date/Time: 06/11/2022 5:25 PM  Performed by: Claudia Desanctis, CRNAPre-anesthesia Checklist: Emergency Drugs available, Patient identified, Suction available and Patient being monitored Patient Re-evaluated:Patient Re-evaluated prior to induction Oxygen Delivery Method: Circle system utilized Preoxygenation: Pre-oxygenation with 100% oxygen Induction Type: IV induction Ventilation: Mask ventilation without difficulty LMA: LMA inserted LMA Size: 4.0 Number of attempts: 1 Placement Confirmation: positive ETCO2 and breath sounds checked- equal and bilateral Tube secured with: Tape Dental Injury: Teeth and Oropharynx as per pre-operative assessment

## 2022-06-11 NOTE — Telephone Encounter (Signed)
Post ED Visit - Positive Culture Follow-up  Culture report reviewed by antimicrobial stewardship pharmacist: [place] Team '[x]'$  Luisa Hart, [place].D. '[]'$  Heide Guile, Pharm.D., BCPS AQ-ID '[]'$  Parks Neptune, Pharm.D., BCPS '[]'$  Alycia Rossetti, Pharm.D., BCPS '[]'$  [place], Pharm.D., BCPS, AAHIVP '[]'$  Legrand Como, Pharm.D., BCPS, AAHIVP '[]'$  Salome Arnt, PharmD, BCPS '[]'$  Johnnette Gourd, PharmD, BCPS '[]'$  Hughes Better, PharmD, BCPS '[]'$  Leeroy Cha, PharmD '[]'$  Laqueta Linden, PharmD, BCPS '[]'$  Albertina Parr, PharmD  [place] Team '[]'$  Leodis Sias, PharmD '[]'$  Lindell Spar, PharmD '[]'$  Royetta Asal, PharmD '[]'$  Graylin Shiver, Rph '[]'$  Rema Fendt) Glennon Mac, PharmD '[]'$  Arlyn Dunning, PharmD '[]'$  Netta Cedars, PharmD '[]'$  Dia Sitter, PharmD '[]'$  Leone Haven, PharmD '[]'$  Gretta Arab, PharmD '[]'$  Theodis Shove, PharmD '[]'$  Peggyann Juba, PharmD '[]'$  Reuel Boom, PharmD   Positive urine culture Not treated. Dr. Gloriann Loan with urology was contacted by pharmacy. See notes for follow up plans.  no further patient follow-up is required at this time.  Glennon Hamilton 06/11/2022, 10:18 AM

## 2022-06-11 NOTE — Progress Notes (Addendum)
ED Antimicrobial Stewardship Positive Culture Follow Up   Regina Martinez is an 61 y.o. female who presented to [facility] on 06/07/2022 with a chief complaint of  Chief Complaint  Patient presents with   Flank Pain    Recent Results (from the past 720 hour(s))  Urine Culture     Status: Abnormal   Collection Time: 06/07/22  8:10 PM   Specimen: Urine, Clean Catch  Result Value Ref Range Status   Specimen Description   Final    URINE, CLEAN CATCH Performed at [facility], [address], [place], [place] [address]    Special Requests   Final    NONE Performed at [facility], [address] [place], [place] [address]    Culture >=100,000 COLONIES/mL KLEBSIELLA PNEUMONIAE (A)  Final   Report Status 06/10/2022 FINAL  Final   Organism ID, Bacteria KLEBSIELLA PNEUMONIAE (A)  Final      Susceptibility   Klebsiella pneumoniae - MIC*    AMPICILLIN RESISTANT Resistant     CEFAZOLIN <=4 SENSITIVE Sensitive     CEFEPIME <=0.12 SENSITIVE Sensitive     CEFTRIAXONE <=0.25 SENSITIVE Sensitive     CIPROFLOXACIN <=0.25 SENSITIVE Sensitive     GENTAMICIN <=1 SENSITIVE Sensitive     IMIPENEM <=0.25 SENSITIVE Sensitive     NITROFURANTOIN 64 INTERMEDIATE Intermediate     TRIMETH/SULFA <=20 SENSITIVE Sensitive     AMPICILLIN/SULBACTAM <=2 SENSITIVE Sensitive     PIP/TAZO <=4 SENSITIVE Sensitive     * >=100,000 COLONIES/mL KLEBSIELLA PNEUMONIAE    '[x]'$  Patient discharged originally without antimicrobial agent and treatment is now indicated  Patient is scheduled for cystoscopy w/ ureteral stent placement by Dr. Gloriann Loan today. Per ED provider, Dr. Gloriann Loan was notified of culture result and sensitivities and asked to prescribe antibiotic therapy if indicated.  ED Provider: Nuala Alpha, PA-C  Addendum: Dr. Gloriann Loan responded and stated that he "gave her rocephin in the office and will give abx before surgery as well. Also sent an rx for bactrim x 10 days to follow  this."  [name] 06/11/2022, 9:57 AM Clinical Pharmacist Monday - Friday phone -  [phone] Saturday - Sunday phone - [phone]

## 2022-06-12 ENCOUNTER — Encounter (HOSPITAL_COMMUNITY): Payer: Self-pay | Admitting: Urology

## 2022-06-12 DIAGNOSIS — N132 Hydronephrosis with renal and ureteral calculous obstruction: Secondary | ICD-10-CM | POA: Diagnosis not present

## 2022-06-12 LAB — GLUCOSE, CAPILLARY
Glucose-Capillary: 149 mg/dL — ABNORMAL HIGH (ref 70–99)
Glucose-Capillary: 176 mg/dL — ABNORMAL HIGH (ref 70–99)

## 2022-06-12 NOTE — Discharge Summary (Signed)
Physician Discharge Summary  Patient ID: Regina Martinez MRN: [id number] DOB/AGE: [birth year] 61 y.o.  Admit date: 06/11/2022 Discharge date: 06/12/2022  Admission Diagnoses:  Discharge Diagnoses:  Principal Problem:   Ureteral calculi   Discharged Condition: good  Hospital Course: 61 year old female with history of right ureteral calculus and UTI underwent ureteral stent placement on 06/11/2022.  Previous culture from 4 days ago was positive for Klebsiella.  Sensitive to Bactrim.  The following day, she was feeling much improved and pain had resolved.  She was discharged in stable condition.  Consults: None  Significant Diagnostic Studies: None  Treatments: surgery: Right ureteral stent placement  Discharge Exam: Blood pressure 100/67, pulse 62, temperature 97.8 F (36.6 C), temperature source Oral, resp. rate 18, height '5\' 1"'$  (1.549 m), weight 67.9 kg, last menstrual period 09/16/2012, SpO2 97 %. General appearance: alert no acute distress Adequate perfusion of extremities Nonlabored respiration  Disposition: Discharge disposition: 01-Home or Self Care       Discharge Instructions     No wound care   Complete by: As directed       Allergies as of 06/12/2022       Reactions   Pork-derived Products         Medication List     TAKE these medications    anastrozole 1 MG tablet Commonly known as: ARIMIDEX Take 1 tablet (1 mg total) by mouth daily.   betamethasone dipropionate 0.05 % lotion Apply 6-8 drops topically See admin instructions. APPLY 6 TO 8 DROPS TO SCALP/EARS DAILY AS DIRECTED NOT TO FACE/GROIN   gabapentin 100 MG capsule Commonly known as: NEURONTIN TAKE ONE CAPSULE BY MOUTH 3 TIMES A DAY   HYDROmorphone 2 MG tablet Commonly known as: Dilaudid Take 1 tablet (2 mg total) by mouth every 6 (six) hours as needed for severe pain.   metFORMIN 500 MG 24 hr tablet Commonly known as: GLUCOPHAGE-XR Take 500 mg by mouth 2 (two) times daily before a  meal.   naproxen 500 MG tablet Commonly known as: NAPROSYN Take 1 tablet (500 mg total) by mouth 2 (two) times daily.   ondansetron 4 MG disintegrating tablet Commonly known as: ZOFRAN-ODT Take 1 tablet (4 mg total) by mouth every 8 (eight) hours as needed for nausea or vomiting.   Otezla 30 MG Tabs Generic drug: Apremilast Take 30 mg by mouth in the morning and at bedtime.   rosuvastatin 10 MG tablet Commonly known as: CRESTOR Take 10 mg by mouth at bedtime.         We will arrange for ureteroscopy in 1 to 2 weeks or so.  You will receive a call from the office.  Signed: Marton Redwood, III 06/12/2022, 11:26 AM

## 2022-06-12 NOTE — TOC Progression Note (Signed)
Transition of Care [facility]) - Progression Note    Patient Details  Name: Regina Martinez MRN: [id number] Date of Birth: 05/18/1961  Transition of Care [facility]) CM/SW Contact  Joaquin Courts, RN Phone Number: 06/12/2022, 8:54 AM  Clinical Narrative:        Transition of Care Department [facility]) has reviewed patient and no TOC needs have been identified at this time. We will continue to monitor patient advancement through interdisciplinary progression rounds. If new patient transition needs arise, please place a TOC consult.

## 2024-04-21 DIAGNOSIS — L400 Psoriasis vulgaris: Secondary | ICD-10-CM | POA: Diagnosis not present

## 2024-07-13 DIAGNOSIS — L400 Psoriasis vulgaris: Secondary | ICD-10-CM | POA: Diagnosis not present

## 2024-07-13 DIAGNOSIS — Z131 Encounter for screening for diabetes mellitus: Secondary | ICD-10-CM | POA: Diagnosis not present

## 2024-07-16 DIAGNOSIS — R7303 Prediabetes: Secondary | ICD-10-CM | POA: Diagnosis not present

## 2024-07-16 DIAGNOSIS — L409 Psoriasis, unspecified: Secondary | ICD-10-CM | POA: Diagnosis not present

## 2024-07-16 DIAGNOSIS — E7800 Pure hypercholesterolemia, unspecified: Secondary | ICD-10-CM | POA: Diagnosis not present

## 2024-07-16 DIAGNOSIS — Z853 Personal history of malignant neoplasm of breast: Secondary | ICD-10-CM | POA: Diagnosis not present
